# Patient Record
Sex: Female | Born: 1975 | Race: White | Hispanic: No | Marital: Married | Smoking: Former smoker
Health system: Southern US, Community
[De-identification: ages and names within clinical notes are randomized; demographics above are authoritative.]

## PROBLEM LIST (undated history)

## (undated) DIAGNOSIS — Z6825 Body mass index (BMI) 25.0-25.9, adult: Secondary | ICD-10-CM

## (undated) DIAGNOSIS — J329 Chronic sinusitis, unspecified: Secondary | ICD-10-CM

## (undated) DIAGNOSIS — R87619 Unspecified abnormal cytological findings in specimens from cervix uteri: Secondary | ICD-10-CM

## (undated) DIAGNOSIS — N83209 Unspecified ovarian cyst, unspecified side: Secondary | ICD-10-CM

## (undated) DIAGNOSIS — R51 Headache: Secondary | ICD-10-CM

## (undated) DIAGNOSIS — IMO0002 Reserved for concepts with insufficient information to code with codable children: Secondary | ICD-10-CM

## (undated) DIAGNOSIS — I10 Essential (primary) hypertension: Secondary | ICD-10-CM

## (undated) DIAGNOSIS — F419 Anxiety disorder, unspecified: Secondary | ICD-10-CM

## (undated) DIAGNOSIS — R002 Palpitations: Secondary | ICD-10-CM

## (undated) DIAGNOSIS — O009 Unspecified ectopic pregnancy without intrauterine pregnancy: Secondary | ICD-10-CM

## (undated) DIAGNOSIS — D649 Anemia, unspecified: Secondary | ICD-10-CM

## (undated) DIAGNOSIS — K219 Gastro-esophageal reflux disease without esophagitis: Secondary | ICD-10-CM

## (undated) HISTORY — DX: Gastro-esophageal reflux disease without esophagitis: K21.9

## (undated) HISTORY — DX: Chronic sinusitis, unspecified: J32.9

## (undated) HISTORY — DX: Palpitations: R00.2

## (undated) HISTORY — DX: Unspecified ovarian cyst, unspecified side: N83.209

## (undated) HISTORY — DX: Anxiety disorder, unspecified: F41.9

## (undated) HISTORY — PX: ECTOPIC PREGNANCY SURGERY: SHX613

## (undated) HISTORY — DX: Anemia, unspecified: D64.9

## (undated) HISTORY — DX: Essential (primary) hypertension: I10

## (undated) HISTORY — DX: Unspecified ectopic pregnancy without intrauterine pregnancy: O00.90

## (undated) HISTORY — DX: Unspecified abnormal cytological findings in specimens from cervix uteri: R87.619

## (undated) HISTORY — DX: Body mass index (BMI) 25.0-25.9, adult: Z68.25

## (undated) HISTORY — PX: TONSILLECTOMY: SHX5217

## (undated) HISTORY — DX: Reserved for concepts with insufficient information to code with codable children: IMO0002

## (undated) HISTORY — PX: CERVICAL CONE BIOPSY: SUR198

## (undated) HISTORY — DX: Headache: R51

---

## 1998-10-30 ENCOUNTER — Other Ambulatory Visit: Admission: RE | Admit: 1998-10-30 | Discharge: 1998-10-30 | Payer: Self-pay | Admitting: Obstetrics and Gynecology

## 1999-06-25 ENCOUNTER — Inpatient Hospital Stay (HOSPITAL_COMMUNITY): Admission: AD | Admit: 1999-06-25 | Discharge: 1999-06-27 | Payer: Self-pay | Admitting: Obstetrics and Gynecology

## 2001-04-29 ENCOUNTER — Other Ambulatory Visit: Admission: RE | Admit: 2001-04-29 | Discharge: 2001-04-29 | Payer: Self-pay | Admitting: Obstetrics and Gynecology

## 2001-10-26 ENCOUNTER — Inpatient Hospital Stay (HOSPITAL_COMMUNITY): Admission: AD | Admit: 2001-10-26 | Discharge: 2001-10-26 | Payer: Self-pay | Admitting: Obstetrics and Gynecology

## 2001-12-25 ENCOUNTER — Inpatient Hospital Stay (HOSPITAL_COMMUNITY): Admission: AD | Admit: 2001-12-25 | Discharge: 2001-12-25 | Payer: Self-pay | Admitting: Obstetrics and Gynecology

## 2001-12-25 ENCOUNTER — Encounter: Payer: Self-pay | Admitting: Obstetrics and Gynecology

## 2002-01-06 ENCOUNTER — Inpatient Hospital Stay (HOSPITAL_COMMUNITY): Admission: AD | Admit: 2002-01-06 | Discharge: 2002-01-07 | Payer: Self-pay | Admitting: Obstetrics and Gynecology

## 2002-12-26 ENCOUNTER — Other Ambulatory Visit: Admission: RE | Admit: 2002-12-26 | Discharge: 2002-12-26 | Payer: Self-pay | Admitting: Obstetrics and Gynecology

## 2003-11-14 ENCOUNTER — Inpatient Hospital Stay (HOSPITAL_COMMUNITY): Admission: AD | Admit: 2003-11-14 | Discharge: 2003-11-14 | Payer: Self-pay | Admitting: Obstetrics and Gynecology

## 2003-11-17 ENCOUNTER — Inpatient Hospital Stay (HOSPITAL_COMMUNITY): Admission: AD | Admit: 2003-11-17 | Discharge: 2003-11-17 | Payer: Self-pay | Admitting: Obstetrics and Gynecology

## 2003-11-20 ENCOUNTER — Inpatient Hospital Stay (HOSPITAL_COMMUNITY): Admission: AD | Admit: 2003-11-20 | Discharge: 2003-11-20 | Payer: Self-pay | Admitting: Obstetrics and Gynecology

## 2003-11-27 ENCOUNTER — Observation Stay (HOSPITAL_COMMUNITY): Admission: AD | Admit: 2003-11-27 | Discharge: 2003-11-28 | Payer: Self-pay | Admitting: Obstetrics and Gynecology

## 2003-11-28 ENCOUNTER — Encounter (INDEPENDENT_AMBULATORY_CARE_PROVIDER_SITE_OTHER): Payer: Self-pay | Admitting: *Deleted

## 2004-07-08 ENCOUNTER — Other Ambulatory Visit: Admission: RE | Admit: 2004-07-08 | Discharge: 2004-07-08 | Payer: Self-pay | Admitting: Obstetrics and Gynecology

## 2005-05-28 ENCOUNTER — Ambulatory Visit (HOSPITAL_BASED_OUTPATIENT_CLINIC_OR_DEPARTMENT_OTHER): Admission: RE | Admit: 2005-05-28 | Discharge: 2005-05-28 | Payer: Self-pay | Admitting: Otolaryngology

## 2005-06-07 ENCOUNTER — Ambulatory Visit: Payer: Self-pay | Admitting: Internal Medicine

## 2005-07-30 ENCOUNTER — Other Ambulatory Visit: Admission: RE | Admit: 2005-07-30 | Discharge: 2005-07-30 | Payer: Self-pay | Admitting: Obstetrics and Gynecology

## 2007-05-20 ENCOUNTER — Emergency Department (HOSPITAL_COMMUNITY): Admission: EM | Admit: 2007-05-20 | Discharge: 2007-05-20 | Payer: Self-pay | Admitting: Emergency Medicine

## 2007-07-06 ENCOUNTER — Encounter: Admission: RE | Admit: 2007-07-06 | Discharge: 2007-07-06 | Payer: Self-pay | Admitting: Family Medicine

## 2007-08-30 HISTORY — PX: US ECHOCARDIOGRAPHY: HXRAD669

## 2007-09-23 ENCOUNTER — Encounter (INDEPENDENT_AMBULATORY_CARE_PROVIDER_SITE_OTHER): Payer: Self-pay | Admitting: Obstetrics and Gynecology

## 2007-09-23 ENCOUNTER — Ambulatory Visit (HOSPITAL_COMMUNITY): Admission: RE | Admit: 2007-09-23 | Discharge: 2007-09-23 | Payer: Self-pay | Admitting: Obstetrics and Gynecology

## 2009-12-31 ENCOUNTER — Ambulatory Visit: Payer: Self-pay | Admitting: Cardiovascular Disease

## 2010-07-22 NOTE — Op Note (Signed)
Karina [name], Karina [name]               ACCOUNT NO.:  [id number]   MEDICAL RECORD NO.:  [id number]          PATIENT TYPE:  AMB   LOCATION:  SDC                           FACILITY:  [facility]   PHYSICIAN:  Janine Limbo, M.D.DATE OF BIRTH:  [birth year]   DATE OF PROCEDURE:  09/23/2007  DATE OF DISCHARGE:                               OPERATIVE REPORT   PREOPERATIVE DIAGNOSIS:  Cervical intraepithelial neoplasia III.   POSTOPERATIVE DIAGNOSIS:  Cervical intraepithelial neoplasia III.   PROCEDURE:  Cold knife conization.   SURGEON:  Leonard Schwartz, MD   FIRST ASSISTANT:  None.   ANESTHESIA:  Monitored anesthetic control, paracervical block using 0.5%  Marcaine with epinephrine.   DISPOSITION:  [name] is a 35 year old female who presents with CIN  III.  She understands the indications for her surgical procedure and she  accepts the risks associated with that procedure.   FINDINGS:  The patient was found to have an area of white epithelium at  the anterior cervical os but also the posterior cervical os.   PROCEDURE IN DETAIL:  The patient was taken to the operating room where  she was given medication through her IV line.  The patient's perineum  and vagina were prepped with multiple layers of Betadine.  Examination  under anesthesia was performed.  The patient was then sterilely draped.  Colposcopy was performed and the white epithelial lesions were noted  anteriorly and posteriorly at the cervical os.  We then placed Lugol's  solution on the cervix and the lesion was appropriately outlined.  A  paracervical block was then placed using 10 mL of 0.5%Marcaine with  epinephrine.  An additional 10 mL of 0.5% Marcaine with epinephrine were  injected directly into the cervix.  Figure-of-eight sutures were placed  at the 3 o'clock and 9 o'clock positions on the lateral cervix.  A  circumferential incision was made around the cervical os removing all of  the nonstaining areas.   The incision was then angled in a cone like  fashion toward the endocervix.  A stitch was placed at the 12 o'clock  position of the conization specimen.  The specimen was then sent to  pathology for evaluation.  The Bovie cautery was used for hemostasis.  We then placed inverting sutures at the 12 o'clock, 3 o'clock, 6  o'clock, and 9 o'clock positions of the cervix.  Hemostasis was  adequate.  Gelfoam was placed in the cervical os.  Sponge, needle, and  instrument counts were correct on two occasions.  The estimated blood  loss was 10 mL.  The patient tolerated her procedure very well.  She was  awakened from her anesthetic without difficulty and taken to the  recovery room in stable condition.   FOLLOWUP INSTRUCTIONS:  The patient will return to see Dr. Stefano Gaul in 2-  3 weeks for followup examination.  She will take Darvocet 1-2 tablets  every 4 hours as needed for severe pain.  She can take Motrin 800 mg  every 8  hours as needed for mild-to-moderate pain.  She was given Zofran 4 mg to  take every 4-6 hours for nausea.  She was given a copy of the  postoperative instruction sheet as prepared by the [facility]  [facility] for patients who have undergone dilatation and curettage, but  then was modified for conization of the cervix.      Janine Limbo, M.D.  Electronically Signed     AVS/MEDQ  D:  09/23/2007  T:  09/23/2007  Job:  [id number]

## 2010-07-25 NOTE — Discharge Summary (Signed)
[name], [name]               ACCOUNT NO.:  [id number]   MEDICAL RECORD NO.:  [id number]          PATIENT TYPE:  OBV   LOCATION:  9305                          FACILITY:  [facility]   PHYSICIAN:  Osborn Coho, M.D.   DATE OF BIRTH:  [birth year]   DATE OF ADMISSION:  11/27/2003  DATE OF DISCHARGE:  11/28/2003                                 DISCHARGE SUMMARY   DISCHARGE DIAGNOSIS:  Ruptured right ectopic pregnancy.   OPERATION:  On the date of admission, the patient underwent a laparoscopic  partial right salpingectomy, tolerating procedure well.  The patient was  found to have a ruptured right ectopic pregnancy with approximately 500 cc  of hemoperitoneum.   HISTORY OF PRESENT ILLNESS:  Karina [name] is a 35 year old female para 2-0-1-  2 who is status post IUD removal with a right ectopic pregnancy which was  treated with methotrexate.  The patient however began to experience  increasing abdominal pain and was therefore admitted to the hospital for  evaluation.  Upon admission, a pelvic ultrasound showed that she had a 7.8  cm right adnexal mass with moderate amount of free fluid in her right  abdominal region which was consistent with a ruptured ectopic pregnancy.   PREOPERATIVE PHYSICAL EXAMINATION:  BLOOD PRESSURE:  128/74.  PULSE:  85.  RESPIRATIONS:  20.  ABDOMEN:  Soft, with right lower quadrant and left lower quadrant  tenderness.  There was no rebound or guarding at the time of exam.  PELVIC:  Positive cervical motion tenderness, with small amount of blood in  the vaginal vault.   LABORATORIES:  The patient's blood type is A positive.  Her antibody screen  is negative.  Hematocrit was 34, and beta HCG 41.   HOSPITAL COURSE:  On the date of admission, the patient underwent  aforementioned procedure, tolerating it well.  Postoperative course was  unremarkable, with patient achieving the maximum benefit of her hospital  stay by the afternoon of her day of admission and was  therefore discharged  home.  Postop hemoglobin was 9.5 (preop hemoglobin 11.8).   DISCHARGE MEDICATIONS:  1.  Motrin 1 tablet q.6 h. as needed for pain.  2.  Vicodin 1-2 tablets q.4-6 h. as needed for pain.  3.  Phenergan 12.5 mg 1 tablet q.6 h. as needed for nausea.   FOLLOWUP:  The patient is to call [facility] for a two-week  postop visit with Dr. Su Hilt.   DISCHARGE INSTRUCTIONS:  The patient was advised to keep her incision site  clean and dry.  To call the office with a temperature of greater than 100.  She was advised to avoid intercourse for six weeks.   Her diet was without restriction.   FINAL PATHOLOGY:  Right fallopian tube:  Fallopian tube with villi and  associated trophoblastic proliferation consistent with ectopic pregnancy.      EJP/MEDQ  D:  12/18/2003  T:  12/19/2003  Job:  [id number]

## 2010-07-25 NOTE — Procedures (Signed)
[name], [name]               ACCOUNT NO.:  [id number]   MEDICAL RECORD NO.:  [id number]          PATIENT TYPE:  OUT   LOCATION:  SLEEP CENTER                 [facility]ACILITY[facility]  [facility]   PHYSICIAN:  Clinton D. Maple Hudson, M.D. DATE OF BIRTH:  March 19, 1975   DATE OF STUDY:  05/28/2005                              NOCTURNAL POLYSOMNOGRAM   INDICATION FOR STUDY:  Hypersomnia with sleep apnea.  Epworth sleepiness  score 4/21, BMI 27.  Weight 165 pounds.  No home medications.   SLEEP ARCHITECTURE:  Total sleep time 378 minutes with sleep efficiency 93%.  Stage I was 1%, stage II 45%, stages III and IV 35%.  REM 19% of total sleep  time.  Sleep latency 20 minutes, REM latency 83 minutes, awake after sleep  onset 7 minutes, arousal index 14.  Advil was taken at bedtime for headache.   RESPIRATORY DATA:  Apnea/hypopnea index (AHI, RDI) 1.3 obstructive events  per hour which is within normal limits (normal 0-5 per hour).  This  reflected a total of 3 obstructive apneas and 5 hypopneas.  Most events were  recorded while sleeping supine.  REM AHI 0.  CPAP titration was not  indicated by protocol.   OXYGEN DATA:  Mild to moderate snoring with mouth breathing, oxygen  desaturation to a nadir of 94%.  Mean oxygen saturation through the study  was 97% on room air.   CARDIAC DATA:  Normal sinus rhythm.   MOVEMENT/PARASOMNIA:  A total of 138 limb jerks were recorded of which 20  were associated with arousal or awakening for a periodic limb movement with  arousable index of 3.2 per hour which is mildly increased.  No bathroom  trips.   IMPRESSION/RECOMMENDATIONS:  1.  Normal sleep architecture without sedative medication other than Advil      taken at bedtime for headache.  2.  Occasional sleep disorder breathing events, apnea/hypopnea index 1.3 per      hour.  Events were positional, associated primarily with supine sleeping      position.  3.  Mild periodic limb movement with arousal, 3.2 per  hour.      Clinton D. Maple Hudson, M.D.  Diplomate, Biomedical engineer of Sleep Medicine  Electronically Signed     CDY/MEDQ  D:  06/07/2005 10:43:06  T:  06/08/2005 12:01:49  Job:  [id number]

## 2010-07-25 NOTE — H&P (Signed)
[facility]  Patient:    Karina [name], Karina [name]                      MRN: [id number] Adm. Date:  [id number] Attending:  Shaune Spittle Dictator:   Mack Guise, C.N.M.                         History and Physical  HISTORY OF PRESENT ILLNESS:   Ms. Cislo is a 35 year old gravida 1, para 0, at 39-4/7 weeks, EDD June 28, 1999, by questionable dates, confirmed by 18 weeks, 0 days ultrasound, who presents to the hospital at 1825 following spontaneous rupture of membranes at home at approximately 5 p.m. this afternoon for a large amount of clear fluid.  She has begun to contract mildly and irregular since rupture of membranes.  She reports positive fetal movement, no bleeding.  Denies any headache, visual changes, or epigastric pain.  Her pregnancy has been followed by the CNM service at [facility] and is remarkable for:  1. Questionable LMP; 2. History of irregular cycles; 3. Rubella nonimmune; 4. Group B strep negative. DD:  06/25/99 TD:  06/25/99 Job: [id number]

## 2010-07-25 NOTE — H&P (Signed)
   NAME:  Karina [name], Karina [name]                         ACCOUNT NO.:  [id number]   MEDICAL RECORD NO.:  [id number]                   PATIENT TYPE:  INP   LOCATION:  9174                                 FACILITY:  [facility]   PHYSICIAN:  Crist Fat. Rivard, M.D.              DATE OF BIRTH:  [birth year]   DATE OF ADMISSION:  01/06/2002  DATE OF DISCHARGE:                                HISTORY & PHYSICAL   HISTORY OF PRESENT ILLNESS:  The patient is a 35 year old gravida 2, para 1-  0-0-1 at 41-2/7 weeks who presents for induction secondary to post date.  Cervix was 2-3 cm in the office last week.   Dictation ended at this point.     Renaldo Reel Emilee Hero, C.N.M.                   Crist Fat Rivard, M.D.    Leeanne Mannan  D:  01/06/2002  T:  01/06/2002  Job:  [id number]

## 2010-07-25 NOTE — H&P (Signed)
NAME:  Karina [name], Karina [name]                         ACCOUNT NO.:  [id number]   MEDICAL RECORD NO.:  [id number]                   PATIENT TYPE:  INP   LOCATION:  9174                                 [facility]ACILITY:  [facility]   PHYSICIAN:  Crist Fat. Rivard, M.D.              DATE OF BIRTH:  10/31/1975   DATE OF ADMISSION:  01/06/2002  DATE OF DISCHARGE:                                HISTORY & PHYSICAL   HISTORY OF PRESENT ILLNESS:  The patient is a 35 year old gravida 2, para 1-  0-0-1 at 41-2/7 weeks who presents for induction secondary to post dates.  Cervix has been 2-3 cm in the office.  Pregnancy has been remarkable for (1)  positive group B strep, (2) a history of irregular cycles, (3) rubella  nonimmune, (4) history of previous transverse lie with resolution to vertex  in the last three weeks.   PRENATAL LABORATORY DATA:  Blood type is A positive, Rh antibody negative,  VDRL nonreactive, rubella titer is nonimmune, hepatitis B surface antigen is  negative.  GC and Chlamydia cultures were negative in February, Pap was  normal in February.  Glucose challenge was normal, AFP was declined.  EDC of  December 30, 2001 was established by ultrasound in April at 11 weeks and  confirmed by 18-week ultrasound.  Group B strep culture was positive at 36  weeks.  Hemoglobin upon entering the practice was 13.  It was 10.9 at 26  weeks.   HISTORY OF PRESENT PREGNANCY:  The patient entered care at approximately 13  weeks.  The patient had also declined cystic fibrosis testing.  She had a  normal ultrasound at 11 weeks and then again at 18 weeks.  She declined AFP.  She had some glucosuria at 29 weeks.  Infant was vertex at 34 weeks, repeat  at 35 weeks showed the same positioning.  Options were reviewed and the  patient elected to proceed with external version.  It was scheduled on  October 3rd.  However, on October 1st she was in the office and the  ultrasound was found to be vertex.  The patient was seen  in Maternity  Admissions Unit at approximately 39 weeks for labor check.  She had  glucosuria again at 38 weeks which showed a value of 139.  She had a fasting  blood sugar at 40 weeks that was within normal limits.  Membranes were  stripped at 40-1/2 weeks and the patient was scheduled for induction per her  request.  Risks and benefits of induction were reviewed with the patient and  she did wish to proceed.   OBSTETRICAL HISTORY:  In April of 2001, she had a vaginal birth of a female  infant, weight 8 pounds 6 ounces at 39-5/7 weeks.  She was in labor 11  hours.  She had epidural anesthesia.   PAST MEDICAL HISTORY:  The only hospitalization was for childbirth.  ALLERGIES:  She has no known medication allergies.   FAMILY HISTORY:  Father has a history of hypertension.  Paternal grandfather  had non-insulin-dependent diabetes.   GENETIC HISTORY:  Unremarkable.   SOCIAL HISTORY:  The patient is married to the father of the baby.  He is  involved and supportive.  His name is Medical sales representative.  The patient has been followed  by the Certified Nurse Midwife Service of [place].  She denies  any alcohol, drug, or tobacco use during this pregnancy.  She has two years  of college and her husband has high school education.  The patient is  employed part-time.   PHYSICAL EXAMINATION:  VITAL SIGNS:  Stable.  The patient is afebrile.  HEENT:  Within normal limits.  LUNGS:  Vibrations are clear.  HEART:  Regular rate and rhythm without murmur.  BREASTS:  Soft and nontender.  ABDOMEN:  Fundal height is approximately 39 weeks.  Estimated fetal weight 8  to 8-1/2 pounds.  Uterine contractions are irregular and mild.  PELVIC:  Cervical exam is deferred at present and will be performed when  artificial rupture of membranes is anticipated after IV antibiotic dose.  FETAL MONITORING:  Fetal heart rate shows a reactive fetal heart rate  tracing on admission.  The patient did have a syncopal  episode with her IV  start with fetal heart rate elevated to a baseline of 150-160 with one  deceleration while the patient was on her back.  This deceleration resolved  with position change and resolution of the syncope.  Fetal heart rate is now  reassuring.  EXTREMITIES:  Deep tendon reflexes are 2+ without clonus.  There is a trace  edema noted.   IMPRESSION:  1. Intrauterine pregnancy at 41-2/7 weeks.  2. Favorable cervix.  3. Positive group B strep.  4. Rubella not immune.   PLAN:  1. Admit to birthing suite for consult with Dois Davenport A. Rivard, M.D. as     attending physician.  2. Routine Certified Nurse Midwife orders.  3. Plan group B strep prophylaxis with penicillin-G per standard dosing.  4. Plan artificial rupture of membranes after IV antibiotic infused.  5. The patient will desire epidural as labor progresses.      Renaldo Reel Karina [name], C.N.M.                   Crist Fat Rivard, M.D.    Karina [name]  D:  01/06/2002  T:  01/06/2002  Job:  [id number]

## 2010-07-25 NOTE — H&P (Signed)
[facility]  Patient:    Karina [name], Karina [name]                      MRN: [id number] Adm. Date:  [id number] Attending:  Shaune Spittle Dictator:   Mack Guise, C.N.M.                         History and Physical  HISTORY OF PRESENT PREGNANCY:                    This patient was initially evaluated at the office of [facility] on December 17, 1998, at approximately [redacted] weeks gestation.  EDD by ? LMP date confirmed by 18-week, 0-day ultrasonography.  The patients prenatal course has been essentially unremarkable.  She has been followed by the C.N.M. service and throughout her prenatal course, the growth of the uterine fundus has been compatible in size with her date.  Systolic blood pressure has ranged from 100-120, diastolic blood pressure has ranged from 60-80.  There has been no proteinuria.  PRENATAL LABORATORY DATA:     On January 03, 1999, hemoglobin and hematocrit 12.4 and 36.1, platelets 299,000.  Blood type and Rh A Positive.  Antibody screen negative.  Toxo titers negative negative.  VDRL nonreactive.  Rubella nonimmune.  Hepatitis B surface antigen negative.  MSA of P testing declined on April 07, 1999.  Twenty-eight week, one-hour glucose challenge 51. Hemoglobin 11.5.  At 36 weeks, culture of vaginal tract is negative for group beta Streptococcus.  OBSTETRICAL HISTORY:          Present pregnancy.  MEDICAL HISTORY:              Unremarkable.  FAMILY HISTORY:               Patients father has hypertension.  Paternal grandfather diabetes.  Paternal aunt seizure disorder on medications.  GENETIC HISTORY:              There is no family history of familial or genetic disorders, children that died, or that were born with birth defects.  ALLERGIES:                    No known drug allergies.  MEDICATIONS:                  Prenatal vitamins.  HABITS:                       The patient denies the use of tobacco, alcohol, or illicit  drugs.  SOCIAL HISTORY:               Ms. Karnes is a 35 year old Caucasian married female.  Her husbands name is Medical sales representative.  He is involved and supportive.  REVIEW OF SYSTEMS:            There are no signs or symptoms suggestive of focal or systemic disease and the patient is typical of one with a uterine pregnancy at term with premature rupture of membranes.  PHYSICAL EXAMINATION  VITAL SIGNS:                  Stable.  Afebrile.  HEENT:                        Unremarkable.  CARDIAC:  Heat is regular in its rate and rhythm.  LUNGS:                        Clear.  ABDOMEN:                      Gravid in its contour.  Uterine fundus is noted to extend 38 cm above the level of the pubic symphysis.  Leopolds maneuver finds to be in a longitudinal lie, cephalic presentation, and the estimated fetal weight is 8 pounds.  The baseline of the fetal heart rate monitor is 130-140 with average lungs and with variability.  Reactivity is present with no periodic changes.  The patient is contracting mildly and irregularly. Since her water broke, she states they are somewhat increasing in her intensity.  PELVIC:                       On exam in MAU, [place] Dom, C.N.M. noted positive pulling, positive nitrazine.  Patients cervix at that time was 2 cm, 75% with a vertex at a -2 station, with copious amounts of clear fluid.  ASSESSMENT:                   1. Intrauterine pregnancy at term.                               2. Premature rupture of membranes.  PLAN:                         1. Admit to birthing suite per                                  Dr. Dierdre Forth.                               2. Routine C.N.M. orders.  CONSENT:                      Discussed options with patient regarding premature rupture of membranes and induction of labor, versus expectant management.  The increased risk of infection was discussed, as well as the risks and benefits of induction of  labor with Pitocin and the recommendation to begin Pitocin at approximately 8 hours after premature rupture of membranes.  The patient and husband verbalized understanding and indicated their desire to start to continue expectant management throughout the night with the hopes of spontaneous labor and if no spontaneous active labor by 5 a.m. will begin Pitocin at that time. DD:  06/25/99 TD:  06/25/99 Job: [id number]

## 2010-07-25 NOTE — Op Note (Signed)
NAMEABRIL, CAPPIELLO               ACCOUNT NO.:  [id number]   MEDICAL RECORD NO.:  [id number]          PATIENT TYPE:  INP   LOCATION:  9305                          FACILITY:  [facility]   PHYSICIAN:  Osborn Coho, M.D.   DATE OF BIRTH:  [birth year]   DATE OF PROCEDURE:  11/28/2003  DATE OF DISCHARGE:  11/28/2003                                 OPERATIVE REPORT   PREOPERATIVE DIAGNOSES:  Ruptured right ectopic.   POSTOPERATIVE DIAGNOSES:  Ruptured right ectopic.   PROCEDURE:  Laparoscopic partial right salpingectomy.   ANESTHESIA:  General.   ATTENDING PHYSICIAN:  Osborn Coho, M.D.   FLUID:  1700 mL.   ESTIMATED BLOOD LOSS:  500 mL in abdomen.   URINE OUTPUT:  100 mL.   COMPLICATIONS:  None.   FINDINGS:  Approximately 500 mL hemoperitoneum and rupture of right ectopic.   PROCEDURE:  The patient was taken to the operating room after the risks,  benefits, and alternatives were discussed with the patient. The patient  verbalized understanding and consent signed and witnessed. The patient was  placed under general anesthesia no acute distress prepped and draped in the  normal sterile fashion in dorsal lithotomy position.  A bivalve speculum was  placed in the patient's vagina and a Hulka tenaculum placed for intrauterine  manipulation.  The speculum was then removed. Attention was then turned to  the abdomen where a 10 mm umbilical incision was made and the Veress needle  passed and pneumoperitoneum achieved. The Veress needle was removed and 10  mm trocar advanced into the abdomen.  Laparoscope introduced and large  hemoperitoneum noted.  A 5 mm incision was made suprapubically and a 5 mm  trocar advanced under direct visualization. The same was done in the left  lower quadrant. Under direct visualization, a 5 mm trocar was introduced  after a 5 mm incision made.  The adnexa was identified and ruptured right  ectopic noted. There is a significant amount of blood in the  cul-de-sac up  the pericolic gutters and around the liver.  Two Vicryl endoloops were  placed around the right fallopian tube with ectopic and the tripolar was  used to cauterize and cut the fallopian tube away which was removed via the  10 mm suprapubic port.  The abdomen was copiously suction irrigated removing  a large portion of the clot and hemoperitoneum.  The pedicle was noted to be  hemostatic.  The 10 mm suprapubic trocar was removed under direct  visualization.  Pneumoperitoneum was released.  Prior to relieving the  pneumoperitoneum, the fascia was repaired at the suprapubic port with the #0  Vicryl in a running fashion.  The pneumoperitoneum was relieved and left  lower quadrant trocar was removed under direct visualization.  The umbilical  trocar and laparoscope were removed while watching the trocar via the  laparoscope.  The umbilical incision was repaired at the fascia using #0  Vicryl in a  running fashion.  The skin was closed with 3-0 Monocryl at the umbilicus,  suprapubic incision and left lower quadrant incision. Sponge, lap and needle  count  was correct. The patient tolerated the procedure well and was returned  to the recovery room in good condition.      AR/MEDQ  D:  11/28/2003  T:  11/28/2003  Job:  [id number]

## 2010-12-01 LAB — I-STAT 8, (EC8 V) (CONVERTED LAB)
Acid-Base Excess: 2
BUN: 13
Bicarbonate: 26.6 — ABNORMAL HIGH
Chloride: 104
Glucose, Bld: 102 — ABNORMAL HIGH
HCT: 37
Hemoglobin: 12.6
Operator id: 222501
Potassium: 3.9
Sodium: 138
TCO2: 28
pCO2, Ven: 41.8 — ABNORMAL LOW
pH, Ven: 7.411 — ABNORMAL HIGH

## 2010-12-01 LAB — URINALYSIS, ROUTINE W REFLEX MICROSCOPIC
Bilirubin Urine: NEGATIVE
Glucose, UA: NEGATIVE
Ketones, ur: NEGATIVE
Leukocytes, UA: NEGATIVE
Nitrite: NEGATIVE
Protein, ur: NEGATIVE
Specific Gravity, Urine: 1.012
Urobilinogen, UA: 0.2
pH: 7

## 2010-12-01 LAB — POCT I-STAT CREATININE
Creatinine, Ser: 0.9
Operator id: 222501

## 2010-12-01 LAB — URINE MICROSCOPIC-ADD ON

## 2010-12-05 LAB — CBC
HCT: 39.2
Hemoglobin: 13.1
MCHC: 33.3
MCV: 89.2
Platelets: 297
RBC: 4.4
RDW: 16.1 — ABNORMAL HIGH
WBC: 6.2

## 2010-12-05 LAB — BASIC METABOLIC PANEL
BUN: 23
CO2: 29
Calcium: 9.4
Chloride: 102
Creatinine, Ser: 0.66
GFR calc Af Amer: >60
GFR calc non Af Amer: >60
Glucose, Bld: 92
Potassium: 4.3
Sodium: 140

## 2010-12-05 LAB — URINALYSIS, ROUTINE W REFLEX MICROSCOPIC
Bilirubin Urine: NEGATIVE
Glucose, UA: NEGATIVE
Ketones, ur: NEGATIVE
Leukocytes, UA: NEGATIVE
Nitrite: NEGATIVE
Protein, ur: NEGATIVE
Specific Gravity, Urine: 1.02
Urobilinogen, UA: 0.2
pH: 7

## 2010-12-05 LAB — URINE MICROSCOPIC-ADD ON

## 2010-12-05 LAB — PREGNANCY, URINE: Preg Test, Ur: NEGATIVE

## 2011-06-12 ENCOUNTER — Encounter: Payer: Self-pay | Admitting: *Deleted

## 2011-06-15 ENCOUNTER — Encounter: Payer: Self-pay | Admitting: Cardiovascular Disease

## 2011-06-15 ENCOUNTER — Ambulatory Visit (INDEPENDENT_AMBULATORY_CARE_PROVIDER_SITE_OTHER): Payer: PRIVATE HEALTH INSURANCE | Admitting: Cardiovascular Disease

## 2011-06-15 VITALS — BP 126/84 | HR 89 | Ht 64.0 in | Wt 161.4 lb

## 2011-06-15 DIAGNOSIS — R002 Palpitations: Secondary | ICD-10-CM | POA: Insufficient documentation

## 2011-06-15 NOTE — Patient Instructions (Signed)
Your physician wants you to follow-up in: 1 YEAR You will receive a reminder letter in the mail two months in advance. If you don't receive a letter, please call our office to schedule the follow-up appointment.  

## 2011-06-15 NOTE — Assessment & Plan Note (Signed)
Karina [name] is doing very well. Karina [name] not having having any further episodes of palpitations. She is committed to working and  stopping smoking. Karina [name] also working on a diet and have her to lose weight.  Karina [name] not required any propranolol recently. We'll see her again in one year for an office visit and EKG.

## 2011-06-15 NOTE — Progress Notes (Signed)
    Corrie Dandy Date of Birth  10/27/1975 [facility] Office  [address]    [address]   [address] [place], [place]  [address]    [place], [place]  [address] [phone]  Fax  [phone]  [phone]  Fax [phone]  Problem list: 1. Chest pains 2. Palpitations 3. Anemia 4. Vitamin D deficiency 5. Anxiety 6. History of anemia  History of Present Illness:  Karina [name] is a very pleasant 36 year old female who have seen in the past for episodes of palpitations, anxiety, chest pain, and vitamin D deficiency. She's done very well since I last saw her last year. She's not required any propranolol. She's been able to do all of her normal activities without any significant problems. Unfortunately she still smokes but she is committed to stopping smoking. She was to join a gym and turn her life around.  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     Current Outpatient Prescriptions  Medication Sig Dispense Refill  . IRON PO Take by mouth daily.      . NON FORMULARY Q Nasal Spray Daily         No Known Allergies  Past Medical History  Diagnosis Date  . Anemia   . GERD (gastroesophageal reflux disease)   . Anxiety   . Sinus infection   . Headache   . Vitamin d deficiency   . Palpitations     Past Surgical History  Procedure Date  . Ectopic pregnancy surgery     ruptured  . Tonsillectomy   . US echocardiography 08-30-2007    EF 60-65%    History  Smoking status  . Current Everyday Smoker  Smokeless tobacco  . Not on file    History  Alcohol Use  . Yes    Daily    No family history on file.  Reviw of Systems:  Reviewed in the HPI.  All other systems are negative.  Physical Exam: Blood pressure 126/84, pulse 89, height 5\' 4"  (1.626 m), weight 161 lb 6.4 oz (73.211 kg). General: Well developed, well nourished, in no acute distress.  Head:  Normocephalic, atraumatic, sclera non-icteric, mucus membranes are moist,   Neck: Supple. Carotids are 2 + without bruits. No JVD  Lungs: Clear bilaterally to auscultation.  Heart: regular rate.  normal  S1 S2. No murmurs, gallops or rubs.  Abdomen: Soft, non-tender, non-distended with normal bowel sounds. No hepatomegaly. No rebound/guarding. No masses.  Msk:  Strength and tone are normal  Extremities: No clubbing or cyanosis. No edema.  Distal pedal pulses are 2+ and equal bilaterally.  Neuro: Alert and oriented X 3. Moves all extremities spontaneously.  Psych:  Responds to questions appropriately with a normal affect.  ECG: 06/15/2011-  NSR. Normal ECG  Assessment / Plan:

## 2011-09-16 ENCOUNTER — Ambulatory Visit (INDEPENDENT_AMBULATORY_CARE_PROVIDER_SITE_OTHER): Payer: BC Managed Care – PPO

## 2011-09-16 ENCOUNTER — Other Ambulatory Visit: Payer: PRIVATE HEALTH INSURANCE

## 2011-09-16 ENCOUNTER — Other Ambulatory Visit: Payer: Self-pay | Admitting: Family Medicine

## 2011-09-16 ENCOUNTER — Telehealth: Payer: Self-pay | Admitting: Obstetrics and Gynecology

## 2011-09-16 ENCOUNTER — Encounter: Payer: Self-pay | Admitting: Obstetrics and Gynecology

## 2011-09-16 ENCOUNTER — Ambulatory Visit (INDEPENDENT_AMBULATORY_CARE_PROVIDER_SITE_OTHER): Payer: BC Managed Care – PPO | Admitting: Obstetrics and Gynecology

## 2011-09-16 VITALS — BP 120/82 | Resp 14 | Ht 65.0 in | Wt 160.0 lb

## 2011-09-16 DIAGNOSIS — B977 Papillomavirus as the cause of diseases classified elsewhere: Secondary | ICD-10-CM

## 2011-09-16 DIAGNOSIS — R109 Unspecified abdominal pain: Secondary | ICD-10-CM

## 2011-09-16 DIAGNOSIS — R102 Pelvic and perineal pain: Secondary | ICD-10-CM

## 2011-09-16 DIAGNOSIS — Z9889 Other specified postprocedural states: Secondary | ICD-10-CM

## 2011-09-16 DIAGNOSIS — D069 Carcinoma in situ of cervix, unspecified: Secondary | ICD-10-CM

## 2011-09-16 DIAGNOSIS — N949 Unspecified condition associated with female genital organs and menstrual cycle: Secondary | ICD-10-CM

## 2011-09-16 LAB — US OB FOLLOW UP

## 2011-09-16 MED ORDER — TRAMADOL HCL 50 MG PO TABS: 50.0000 mg | ORAL_TABLET | Freq: Four times a day (QID) | ORAL | Status: DC | PRN

## 2011-09-16 NOTE — Progress Notes (Signed)
HISTORY OF PRESENT ILLNESS  [name] is a 36 y.o. year old female,No obstetric history on file., who presents for a problem visit. The patient has a history of an ovarian cyst causing pelvic pain.  She has a history of CIN-3 and HPV.  She is status post conization of the cervix.  The patient complains of a 2 to three-week history of right and left lateral abdominal pain.  She complains of hematuria.  She was evaluated by her primary physician who treated her with antibiotics.  She reports that her urine culture was negative.  Her lab work was negative.  Subjective:  The patient complains of abdominal pain and hematuria.  She denies GI and GU symptoms other than above.  She has had some nausea in the morning. Complains of irregular spotting.  She has not been sexually active for 2-3 weeks.  Objective:  BP 120/82  Resp 14  Ht 5\' 5"  (1.651 m)  Wt 160 lb (72.576 kg)  BMI 26.63 kg/m2  LMP 08/26/2011   General: alert, cooperative and no distress Resp: clear to auscultation bilaterally Cardio: regular rate and rhythm, S1, S2 normal, no murmur, click, rub or gallop GI: soft, non-tender; bowel sounds normal; no masses,  no organomegaly and slight tenderness with palpation of the mid abdomen laterally   External genitalia: normal general appearance Vaginal: normal without tenderness, induration or masses Cervix: normal appearance and small amount of blood present Adnexa: normal bimanual exam Uterus: normal size, tender fundus  Assessment:  Abdominal pain History of ovarian cysts Irregular bleeding 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  Endometrium 0.66 cm.  Ovaries normal size and appearance.  Resolving corpus luteum cyst on the right measuring 1.6 cm.  No other abnormalities noted Ultram 50 mg tablets one or 2 every 4-6 hours as needed for pain The etiologies for abdominal pain were reviewed including ruptured  ovarian cysts and kidney stones. Check pregnancy test - negative.  Return to office prn if symptoms worsen or fail to improve.   Leonard Schwartz M.D.  09/16/2011 2:39 PM    .

## 2011-09-16 NOTE — Telephone Encounter (Signed)
Returned pt's call regarding wanting an ov today for poss ovarian cyst. Pt stated that she saw her PCP for hematuria and had pain with palpated pelvic area. PCP had scheduled pt to have an U/S @ [facility] however pt wanted to come in for evaluation. Pt has ov with Dr. Stefano Gaul @ 1:45. Mathis Bud

## 2011-09-16 NOTE — Telephone Encounter (Signed)
Triage/pt last seen on 02/25/2010-no future appt.

## 2011-09-16 NOTE — Telephone Encounter (Signed)
Tc to pt regarding message. Pt got another call from someone in the office pt continued with that call.

## 2011-09-25 ENCOUNTER — Other Ambulatory Visit: Payer: Self-pay | Admitting: Obstetrics and Gynecology

## 2011-09-25 DIAGNOSIS — R102 Pelvic and perineal pain: Secondary | ICD-10-CM

## 2011-10-21 ENCOUNTER — Ambulatory Visit
Admission: RE | Admit: 2011-10-21 | Discharge: 2011-10-21 | Disposition: A | Discharge status: Home / Self Care | Payer: BC Managed Care – PPO | Source: Ambulatory Visit | Attending: Family Medicine | Admitting: Family Medicine

## 2011-10-21 ENCOUNTER — Other Ambulatory Visit: Payer: Self-pay | Admitting: Family Medicine

## 2011-10-21 DIAGNOSIS — M545 Low back pain, unspecified: Secondary | ICD-10-CM

## 2011-10-21 DIAGNOSIS — R10814 Left lower quadrant abdominal tenderness: Secondary | ICD-10-CM

## 2011-10-26 ENCOUNTER — Telehealth: Payer: Self-pay | Admitting: Obstetrics and Gynecology

## 2011-10-26 ENCOUNTER — Encounter: Payer: Self-pay | Admitting: Obstetrics and Gynecology

## 2011-10-26 ENCOUNTER — Ambulatory Visit (INDEPENDENT_AMBULATORY_CARE_PROVIDER_SITE_OTHER): Payer: BC Managed Care – PPO | Admitting: Obstetrics and Gynecology

## 2011-10-26 VITALS — BP 126/68 | Temp 98.5°F | Ht 62.0 in | Wt 164.0 lb

## 2011-10-26 DIAGNOSIS — N83209 Unspecified ovarian cyst, unspecified side: Secondary | ICD-10-CM | POA: Insufficient documentation

## 2011-10-26 DIAGNOSIS — R109 Unspecified abdominal pain: Secondary | ICD-10-CM

## 2011-10-26 DIAGNOSIS — M549 Dorsalgia, unspecified: Secondary | ICD-10-CM

## 2011-10-26 LAB — POCT URINALYSIS DIPSTICK
Bilirubin, UA: NEGATIVE
Glucose, UA: NEGATIVE
Ketones, UA: NEGATIVE
Nitrite, UA: NEGATIVE
Spec Grav, UA: 1.025
Urobilinogen, UA: NEGATIVE
pH, UA: 5

## 2011-10-26 LAB — POCT WET PREP (WET MOUNT)
Bacteria Wet Prep HPF POC: NEGATIVE
Clue Cells Wet Prep Whiff POC: NEGATIVE
KOH Wet Prep POC: NEGATIVE
Trichomonas Wet Prep HPF POC: NEGATIVE
WBC, Wet Prep HPF POC: NEGATIVE
pH: 5.5

## 2011-10-26 NOTE — Progress Notes (Signed)
C/o of ongoing lower abd pain and a lower back pain so bad I can hardly stand up in the mornings, the back is worse than the abdomen, no N,V,D, or constipation, daily bm, normal menses this month crampy, no concerns with vaginal discharge or itching, declines STD testing. On bactrim DS for UTI. Calm, no distress cooperative Neg CVAT, bowel sounds active x 4, no masses palpable, no guarding, Normal hair distrubition mons pubis,  EGBUS WNL, sterile speculum exam,  vagina pink, moist normal rugae,  cerix LTC, no cervical motion tenderness, No adnexal masses or tenderness Wet prep neg A muscular skeletal pain P    Referral to orthopedics, ice, heat alternating to back, discussed role of physical therapy, aleve BID q 12 hours, report if symptoms change or worsen, finish Bactrim DS RX,  F/O  annually Lavera Guise, CNM

## 2011-10-26 NOTE — Telephone Encounter (Signed)
Tc to pt regarding msg below.  Pt states has been having lower abd pain. Was given tx for BV, but continues with the pain.  Per MK ok to put on schedule, pt w/i today @1430 .

## 2011-10-26 NOTE — Progress Notes (Signed)
Contraception: none History of STD:  no history of PID, STD's History of ovarian cyst: yes:   History of fibroids: no History of endometriosis:no Previous ultrasound: yes  Urinary symptoms: none Gastro-intestinal symptoms:  Constipation: no     Diarrhea: no     Nausea: no     Vomiting: no     Fever: no Vaginal discharge: no vaginal discharge  Pt c/o lower back pain and abdominal pain. Pt is currently being treated for bacterial infection

## 2011-11-19 ENCOUNTER — Ambulatory Visit (INDEPENDENT_AMBULATORY_CARE_PROVIDER_SITE_OTHER): Payer: BC Managed Care – PPO | Admitting: Obstetrics and Gynecology

## 2011-11-19 ENCOUNTER — Encounter: Payer: Self-pay | Admitting: Obstetrics and Gynecology

## 2011-11-19 VITALS — BP 110/70 | Resp 18 | Wt 163.0 lb

## 2011-11-19 DIAGNOSIS — R87610 Atypical squamous cells of undetermined significance on cytologic smear of cervix (ASC-US): Secondary | ICD-10-CM

## 2011-11-19 NOTE — Progress Notes (Signed)
Regular Periods: yes Mammogram: no  Monthly Breast Ex.: yes Exercise: yes  Tetanus < 10 years: no Seatbelts: yes  NI. Bladder Functn.: yes Abuse at home: no  Daily BM's: yes Stressful Work: no  Healthy Diet: yes Sigmoid-Colonoscopy: never  Calcium: no Medical problems this year: "no complaints just regular check up"   LAST PAP:09/04/10 ASCUS  Contraception: none  Mammogram:  none  PCP: wong, francis  PMH: no change  FMH: no change  Last Bone Scan: never

## 2011-11-21 ENCOUNTER — Encounter: Payer: Self-pay | Admitting: Obstetrics and Gynecology

## 2011-11-21 NOTE — Progress Notes (Signed)
Subjective:    Karina [name] is a 36 y.o. female, [phone], who presents for an annual exam.   She states she continues to have persistent abdominal pain last several months, had w/u per AVS in July and f/u w [facility] in August, w negative findings. She states she wakes up with this pain and it is somewhat better after initially voiding, but typically lasts a few hours in the AM. Nothing seems to make the pain worse or better, other than time passing.  She denies any urinary complaints, reports daily BM,  denies any discharge, no change in menstrual cycles. She denies any N/V.  She does report occasional dyspareunia w deep penetration. 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 She is not using any birthing control and does not desire pregnancy.     History   Social History  . Marital Status: Married    Spouse Name: N/A    Number of Children: N/A  . Years of Education: N/A   Social History Main Topics  . Smoking status: Current Every Day Smoker  . Smokeless tobacco: None  . Alcohol Use: Yes     Daily  . Drug Use: None  . Sexually Active: Yes    Birth Control/ Protection: None   Other Topics Concern  . None   Social History Narrative  . None    Menstrual cycle:   LMP: Patient's last menstrual period was 11/07/2011.           Cycle: regular "have always been heavy"   The following portions of the patient's history were reviewed and updated as appropriate: allergies, current medications, past family history, past medical history, past social history, past surgical history and problem list.  Review of Systems Pertinent items are noted in HPI. Breast:Negative for breast lump,nipple discharge or nipple retraction Gastrointestinal: positive for abdominal pain, no change in bowel habits or rectal bleeding Urinary:negative   Objective:    BP 110/70  Resp 18  Wt 163 lb (73.936 kg)  LMP 11/07/2011    Weight:  Wt Readings from Last 1 Encounters:  11/19/11 163 lb (73.936 kg)           BMI: There is no height on file to calculate BMI.  General Appearance: Alert, appropriate appearance for age. No acute distress HEENT: Grossly normal Neck / Thyroid: Supple, no masses, nodes or enlargement Lungs: clear to auscultation bilaterally Back: No CVA tenderness Breast Exam: No masses or nodes.No dimpling, nipple retraction or discharge. Cardiovascular: Regular rate and rhythm. S1, S2, no murmur Gastrointestinal: Soft, tender to deep palpation just above umbilicus , no masses or organomegaly Pelvic Exam: Vulva and vagina appear normal. Bimanual exam reveals normal uterus and adnexa. No masses, neg CMT.  Rectovaginal: not indicated Lymphatic Exam: Non-palpable nodes in neck, clavicular, axillary, or inguinal regions  Skin: no rash or abnormalities Neurologic: Normal gait and speech, no tremor  Psychiatric: Alert and oriented, appropriate affect.   Wet Prep: not indicated Urinalysis:not applicable UPT: Not done   Assessment:    Normal gyn exam  Abdominal pain does not appear to be GYN in origin    Plan:    pap smear return annually or prn STD screening: declined Contraception:natural family planning (NFP), rhythm method I recommend pt refer to her primary care physician and consider a GI referral for ongoing abd pain.  Recommend quitting smoking Continue other healthy habits, exercise, sun-screen       Malissa Hippo, CNM

## 2011-11-23 LAB — PAP IG W/ RFLX HPV ASCU

## 2011-11-24 LAB — HUMAN PAPILLOMAVIRUS, HIGH RISK: HPV DNA High Risk: NOT DETECTED

## 2011-11-25 ENCOUNTER — Telehealth: Payer: Self-pay

## 2011-11-25 NOTE — Telephone Encounter (Signed)
TC TO PT REGARDING PAP. INFORMED PT THAT PAP WAS ABNORMAL AND PER SL PT NEED COLPOSCOPY. WENT OVER COLPO PROTOCOL WITH PT AND SCHEDULED PT WITH AVS ON 12-16-11 AT 10:15. PT VOICED UNDERSTANDING.

## 2011-12-01 ENCOUNTER — Other Ambulatory Visit: Payer: Self-pay | Admitting: Family Medicine

## 2011-12-01 DIAGNOSIS — R109 Unspecified abdominal pain: Secondary | ICD-10-CM

## 2011-12-03 ENCOUNTER — Ambulatory Visit
Admission: RE | Admit: 2011-12-03 | Discharge: 2011-12-03 | Disposition: A | Discharge status: Home / Self Care | Payer: BC Managed Care – PPO | Source: Ambulatory Visit | Attending: Family Medicine | Admitting: Family Medicine

## 2011-12-03 DIAGNOSIS — R109 Unspecified abdominal pain: Secondary | ICD-10-CM

## 2011-12-03 MED ORDER — IOHEXOL 300 MG/ML  SOLN
100.0000 mL | Freq: Once | INTRAMUSCULAR | Status: AC | PRN
  Administered 2011-12-03: 100 mL via INTRAVENOUS

## 2011-12-16 ENCOUNTER — Encounter: Payer: Self-pay | Admitting: Obstetrics and Gynecology

## 2011-12-16 ENCOUNTER — Ambulatory Visit (INDEPENDENT_AMBULATORY_CARE_PROVIDER_SITE_OTHER): Payer: BC Managed Care – PPO | Admitting: Obstetrics and Gynecology

## 2011-12-16 VITALS — BP 122/82 | Wt 166.0 lb

## 2011-12-16 DIAGNOSIS — R6889 Other general symptoms and signs: Secondary | ICD-10-CM

## 2011-12-16 DIAGNOSIS — D649 Anemia, unspecified: Secondary | ICD-10-CM

## 2011-12-16 DIAGNOSIS — IMO0001 Reserved for inherently not codable concepts without codable children: Secondary | ICD-10-CM

## 2011-12-16 MED ORDER — IRON 325 (65 FE) MG PO TABS: 65.0000 mg | ORAL_TABLET | Freq: Two times a day (BID) | ORAL | Status: DC

## 2011-12-16 NOTE — Progress Notes (Signed)
HISTORY OF PRESENT ILLNESS  [name] is a 36 y.o. year old female,[id number], who presents for a problem visit. The patient has a past history of CIN-3 and she has had a conization in the past.  Her most recent Pap smear showed ASCUS with negative HPV.  The patient has a history of anemia and she needs a refill on her iron.  Subjective:  The patient continues to have abdominal pain.  She is being evaluated by her primary physician.  Objective:  BP 122/82  Wt 166 lb (75.297 kg)  LMP 12/02/2011   General: no distress GI: soft, non-tender; no masses,  no organomegaly and soft and nontender  External genitalia: normal general appearance Vaginal: normal without tenderness, induration or masses and relaxation noted Cervix: see colposcopy note Adnexa: normal bimanual exam Uterus: nontender, normal size  Urine pregnancy test: Negative  COLPOSCOPY NOTE:  The colposcopy procedure was explained.  The patient's questions were answered. A speculum exam was performed.  The cervix was prepped with acetic acid and Hurricaine gel.  The cervix was evaluated using a white light and the green filter. Findings: no lesions seen .  The endocervical canal was clear.  No lesions were seen.  Biopsies obtained: none.  Hemostasis was adequate.  An endocervical curettage was performed.  Again, hemostasis was adequate. The procedure was terminated.  The patient tolerated her procedure well.  The specimens were sent to pathology.  Assessment:  Status post conization of the cervix for CIN-3.  Most recent Pap showed ASCUS with negative HPV.  Anemia  Plan:  Colposcopy performed today but no lesion seen.  Patient not charged.  Iron 325 mg twice a day  Return to office in 11 month(s).   Leonard Schwartz M.D.  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

## 2012-06-03 ENCOUNTER — Ambulatory Visit (INDEPENDENT_AMBULATORY_CARE_PROVIDER_SITE_OTHER): Payer: Self-pay | Admitting: Family Medicine

## 2012-06-03 ENCOUNTER — Encounter: Payer: Self-pay | Admitting: Family Medicine

## 2012-06-03 VITALS — BP 150/100 | HR 92 | Temp 98.8°F | Resp 16 | Wt 172.0 lb

## 2012-06-03 DIAGNOSIS — M545 Low back pain, unspecified: Secondary | ICD-10-CM

## 2012-06-03 DIAGNOSIS — D649 Anemia, unspecified: Secondary | ICD-10-CM

## 2012-06-03 MED ORDER — ESOMEPRAZOLE MAGNESIUM 40 MG PO CPDR: 40.0000 mg | DELAYED_RELEASE_CAPSULE | Freq: Two times a day (BID) | ORAL | Status: DC

## 2012-06-03 MED ORDER — AMOXICILLIN-POT CLAVULANATE 875-125 MG PO TABS: 1.0000 | ORAL_TABLET | Freq: Two times a day (BID) | ORAL | Status: DC

## 2012-06-03 MED ORDER — IRON 325 (65 FE) MG PO TABS: 65.0000 mg | ORAL_TABLET | Freq: Two times a day (BID) | ORAL | Status: DC

## 2012-06-03 NOTE — Progress Notes (Signed)
Subjective:     Patient ID: Karina [name], female   DOB: [birth year], 37 y.o.   MRN: [id number]  HPI  7 days of bilateral maxillary sinus pressure, pain, congestion, rhinorrhea, and fevers.  She reports pain in her teeth, sinus headaches postnasal drip and cough.   Past Medical History  Diagnosis Date  . Anemia   . GERD (gastroesophageal reflux disease)   . Anxiety   . Sinus infection   . Headache   . Vitamin D deficiency   . Palpitations   . Ovarian cyst   . Abnormal Pap smear    Current Outpatient Prescriptions on File Prior to Visit  Medication Sig Dispense Refill  . NON FORMULARY Q Nasal Spray Daily       No current facility-administered medications on file prior to visit.    Review of Systems  Constitutional: Positive for fever, chills and fatigue.  HENT: Positive for congestion, rhinorrhea and postnasal drip. Negative for ear pain, nosebleeds, facial swelling, neck pain and neck stiffness.   Respiratory: Positive for cough. Negative for wheezing and stridor.   Cardiovascular: Negative.   Gastrointestinal: Negative.        Objective:   Physical Exam  Constitutional: She appears well-developed and well-nourished.  HENT:  Head: Normocephalic and atraumatic.  Right Ear: External ear normal.  Left Ear: External ear normal.  Nose: Mucosal edema and rhinorrhea present. Right sinus exhibits maxillary sinus tenderness and frontal sinus tenderness. Left sinus exhibits maxillary sinus tenderness and frontal sinus tenderness.  Eyes: Conjunctivae and EOM are normal. Pupils are equal, round, and reactive to light.  Neck: Neck supple. No thyromegaly present.  Cardiovascular: Normal rate, regular rhythm and normal heart sounds.   Pulmonary/Chest: Effort normal and breath sounds normal.       Assessment:     Sinusitis Low-back pain    Plan:    Augmentin 875 mg by mouth twice a day quantity sufficient for 10 days. Consult PT regarding low back.  LS-spine x-ray obtained in  August of 2013 was normal.

## 2012-06-27 ENCOUNTER — Ambulatory Visit: Payer: BC Managed Care – PPO | Attending: Family Medicine | Admitting: Physical Therapy

## 2012-06-27 ENCOUNTER — Encounter: Payer: Self-pay | Admitting: Physical Therapy

## 2013-05-31 ENCOUNTER — Other Ambulatory Visit: Payer: Self-pay | Admitting: Family Medicine

## 2013-05-31 DIAGNOSIS — Z0000 Encounter for general adult medical examination without abnormal findings: Secondary | ICD-10-CM

## 2013-06-16 ENCOUNTER — Encounter: Payer: BC Managed Care – PPO | Admitting: Family Medicine

## 2014-01-08 ENCOUNTER — Encounter: Payer: Self-pay | Admitting: Family Medicine

## 2014-01-22 ENCOUNTER — Other Ambulatory Visit: Payer: Self-pay | Admitting: Obstetrics and Gynecology

## 2014-01-22 DIAGNOSIS — Z1231 Encounter for screening mammogram for malignant neoplasm of breast: Secondary | ICD-10-CM

## 2014-02-07 ENCOUNTER — Telehealth: Payer: Self-pay | Admitting: Family Medicine

## 2014-02-09 ENCOUNTER — Encounter: Payer: Self-pay | Admitting: Family Medicine

## 2014-02-09 ENCOUNTER — Ambulatory Visit (INDEPENDENT_AMBULATORY_CARE_PROVIDER_SITE_OTHER): Payer: BC Managed Care – PPO | Admitting: Family Medicine

## 2014-02-09 VITALS — BP 122/60 | HR 76 | Temp 98.2°F | Resp 14 | Ht 65.0 in | Wt 172.0 lb

## 2014-02-09 DIAGNOSIS — Z720 Tobacco use: Secondary | ICD-10-CM

## 2014-02-09 DIAGNOSIS — R1013 Epigastric pain: Secondary | ICD-10-CM

## 2014-02-09 DIAGNOSIS — R3129 Other microscopic hematuria: Secondary | ICD-10-CM

## 2014-02-09 DIAGNOSIS — F430 Acute stress reaction: Secondary | ICD-10-CM

## 2014-02-09 DIAGNOSIS — F17200 Nicotine dependence, unspecified, uncomplicated: Secondary | ICD-10-CM

## 2014-02-09 DIAGNOSIS — R312 Other microscopic hematuria: Secondary | ICD-10-CM

## 2014-02-09 DIAGNOSIS — K219 Gastro-esophageal reflux disease without esophagitis: Secondary | ICD-10-CM

## 2014-02-09 DIAGNOSIS — R5383 Other fatigue: Secondary | ICD-10-CM

## 2014-02-09 LAB — URINALYSIS, MICROSCOPIC ONLY: Crystals: NONE SEEN

## 2014-02-09 LAB — URINALYSIS, ROUTINE W REFLEX MICROSCOPIC
Bilirubin Urine: NEGATIVE
Glucose, UA: NEGATIVE mg/dL
Ketones, ur: NEGATIVE mg/dL
Leukocytes, UA: NEGATIVE
Nitrite: NEGATIVE
Protein, ur: NEGATIVE mg/dL
Specific Gravity, Urine: >1.03 — ABNORMAL HIGH (ref 1.005–1.030)
Urobilinogen, UA: 0.2 mg/dL (ref 0.0–1.0)
pH: 6 (ref 5.0–8.0)

## 2014-02-09 LAB — VITAMIN B12: Vitamin B-12: 308 pg/mL (ref 211–911)

## 2014-02-09 NOTE — Progress Notes (Signed)
Patient ID: Karina DandyStacy D Bursch, female   DOB: [birth year], 38 y.o.   MRN: [id number]   Subjective:    Patient ID: Karina [name], female    DOB: [birth year], 38 y.o.   MRN: [id number]  Patient presents for Discuss GYN Lab Results  patient here to follow GYN labs. She went in to have blood work done by her GYN because she is having a few months of chest rest as well as strange abdominal symptoms fatigue. There's been a lot of stress between her and her husband they're currently in therapy but she is not on any antidepressant medication. Her labs were reviewed and showed a minimally low vitamin D which she will start on vitamin D replacement this week she also had an upper limits testosterone level her thyroid was normal she has history of iron deficiency anemia her hemoglobin was normal her platelets were normal her metabolic panel was normal and her other hormone labs were normal. She continues to have some epigastric pain on and off she does not take her Nexium on a regular basis she was told that she may have ulcers in the past. She typically gets pain after she eats though this is a side note to the fatigue that she had been experiencing recently. He did have a urinalysis done which showed some blood in the urine and she is a smoker. Her menstrual cycle is still monthly but irregular   Review Of Systems:  GEN- denies fatigue, fever, weight loss,weakness, recent illness HEENT- denies eye drainage, change in vision, nasal discharge, CVS- denies chest pain, palpitations RESP- denies SOB, cough, wheeze ABD- denies N/V, change in stools,+ abd pain GU- denies dysuria, hematuria, dribbling, incontinence MSK- denies joint pain, muscle aches, injury Neuro- denies headache, dizziness, syncope, seizure activity       Objective:    BP 122/60 mmHg  Pulse 76  Temp(Src) 98.2 F (36.8 C) (Oral)  Resp 14  Ht 5\' 5"  (1.651 m)  Wt 172 lb (78.019 kg)  BMI 28.62 kg/m2  LMP 02/05/2014 (Approximate) GEN- NAD,  alert and oriented x3 HEENT- PERRL, EOMI, non injected sclera, pink conjunctiva, MMM, oropharynx clear Neck- Supple, no thyromegaly CVS- RRR, no murmur RESP-CTAB ABD-NABS,soft,mild TTP epigastrium,ND, no CVA tenderness EXT- No edema Psych- normal affect and mood Pulses- Radial 2+        Assessment & Plan:      Problem List Items Addressed This Visit    GERD (gastroesophageal reflux disease)   Abdominal pain   Relevant Orders      H. pylori breath test    Other Visit Diagnoses    Microscopic hematuria    -  Primary    Recheck UA she is a smoker may need urology evaluation pending other bloodwork    Relevant Orders       Urinalysis, Routine w reflex microscopic (Completed)    Other fatigue        Discussed regular exercise, she is also seeing therapist, will hold on any other meds    Relevant Orders       Vitamin B12    Stress reaction           Note: This dictation was prepared with Dragon dictation along with smaller phrase technology. Any transcriptional errors that result from this process are unintentional.

## 2014-02-09 NOTE — Assessment & Plan Note (Signed)
Discussed tobacco cessation 

## 2014-02-09 NOTE — Patient Instructions (Addendum)
Try the vitamin B12 over the counter Urea breath test F/U pending results

## 2014-02-12 LAB — H. PYLORI BREATH TEST: H. pylori Breath Test: NOT DETECTED

## 2014-02-15 ENCOUNTER — Ambulatory Visit: Payer: BC Managed Care – PPO

## 2014-02-19 ENCOUNTER — Encounter: Payer: Self-pay | Admitting: Physician Assistant

## 2014-02-19 ENCOUNTER — Ambulatory Visit (INDEPENDENT_AMBULATORY_CARE_PROVIDER_SITE_OTHER): Payer: BC Managed Care – PPO | Admitting: Physician Assistant

## 2014-02-19 VITALS — BP 124/88 | HR 88 | Temp 98.8°F | Resp 18 | Wt 172.0 lb

## 2014-02-19 DIAGNOSIS — J988 Other specified respiratory disorders: Secondary | ICD-10-CM

## 2014-02-19 DIAGNOSIS — B9689 Other specified bacterial agents as the cause of diseases classified elsewhere: Secondary | ICD-10-CM

## 2014-02-19 MED ORDER — AZITHROMYCIN 250 MG PO TABS: ORAL_TABLET | ORAL | Status: DC

## 2014-02-19 NOTE — Progress Notes (Signed)
    Patient ID: Karina DandyStacy [name] Sedlack MRN: [id number], DOB: [birth year], 38 y.o. Date of Encounter: 02/19/2014, 2:31 PM    Chief Complaint:  Chief Complaint  Patient presents with  . c/o sinus infection    green secretions     HPI: 38 y.o. year old white female is that she has been sick for about 7 days now. Is that she has head and nasal congestion with dark mucus. Is that she also has chest congestion with cough. No significant sore throat or ear ache. No fevers or chills. Using over-the-counter Mucinex and decongestants without much relief.     Home Meds:   Outpatient Prescriptions Prior to Visit  Medication Sig Dispense Refill  . esomeprazole (NEXIUM) 40 MG capsule Take 1 capsule (40 mg total) by mouth 2 (two) times daily. 180 capsule 1  . Ferrous Sulfate (IRON) 325 (65 FE) MG TABS Take 65 mg by mouth 2 (two) times daily. 180 each 1  . NON FORMULARY Q Nasal Spray Daily    . Vitamin [name], Ergocalciferol, (DRISDOL) 50000 UNITS CAPS capsule Take 50,000 Units by mouth every 7 (seven) days.     No facility-administered medications prior to visit.    Allergies:  Allergies  Allergen Reactions  . Prednisone     "jitters"      Review of Systems: See HPI for pertinent ROS. All other ROS negative.    Physical Exam: Blood pressure 124/88, pulse 88, temperature 98.8 F (37.1 C), temperature source Oral, resp. rate 18, weight 172 lb (78.019 kg), last menstrual period 02/05/2014., Body mass index is 28.62 kg/(m^2). General:  WNWD WF. Appears in no acute distress. HEENT: Normocephalic, atraumatic, eyes without discharge, sclera non-icteric, nares are without discharge. Bilateral auditory canals clear, TM's are dull.  Oral cavity moist, posterior pharynx without exudate, erythema, peritonsillar abscess. Right Posterior Pharynx--localized area of erythema ulcer approx 3 mm.   Neck: Supple. No thyromegaly. No lymphadenopathy. No enlarged lymph nodes and no tender lymph nodes. Lungs: Clear  bilaterally to auscultation without wheezes, rales, or rhonchi. Breathing is unlabored. Heart: Regular rhythm. No murmurs, rubs, or gallops. Msk:  Strength and tone normal for age. Extremities/Skin: Warm and dry.  No rashes. Neuro: Alert and oriented X 3. Moves all extremities spontaneously. Gait is normal. CNII-XII grossly in tact. Psych:  Responds to questions appropriately with a normal affect.     ASSESSMENT AND PLAN:  38 y.o. year old female with  1. Bacterial respiratory infection - azithromycin (ZITHROMAX) 250 MG tablet; Day 1: Take 2 daily.  Days 2-5: Take 1 daily.  Dispense: 6 tablet; Refill: 0 Complete antibiotics as directed. Continue expectorant and decongestant as needed for symptom relief. Follow-up if symptoms do not resolve within 1 week after completion of antibiotic.  [address] Glen Creek [address]igned, Kirstina Leinweber Beth WyomingDixon, [place], The Center For Special SurgeryBSFM 02/19/2014 2:31 PM

## 2014-02-20 ENCOUNTER — Encounter: Payer: Self-pay | Admitting: Family Medicine

## 2014-03-12 ENCOUNTER — Encounter (INDEPENDENT_AMBULATORY_CARE_PROVIDER_SITE_OTHER): Payer: Self-pay

## 2014-03-12 ENCOUNTER — Ambulatory Visit
Admission: RE | Admit: 2014-03-12 | Discharge: 2014-03-12 | Disposition: A | Discharge status: Home / Self Care | Payer: BLUE CROSS/BLUE SHIELD | Source: Ambulatory Visit | Attending: Obstetrics and Gynecology | Admitting: Obstetrics and Gynecology

## 2014-03-12 DIAGNOSIS — Z1231 Encounter for screening mammogram for malignant neoplasm of breast: Secondary | ICD-10-CM

## 2014-06-07 ENCOUNTER — Telehealth: Payer: Self-pay | Admitting: Family Medicine

## 2014-06-07 MED ORDER — BECLOMETHASONE DIPROPIONATE 80 MCG/ACT NA AERS: 2.0000 | INHALATION_SPRAY | Freq: Every day | NASAL | Status: DC

## 2014-06-07 NOTE — Telephone Encounter (Signed)
[phone]  PT is needing a refill on Qnasl 80 mcg per spray ( was prescribed Childrens but needs to be adults) [facility]

## 2014-06-07 NOTE — Telephone Encounter (Signed)
Prescription sent to pharmacy.

## 2015-02-26 ENCOUNTER — Encounter: Payer: Self-pay | Admitting: Family Medicine

## 2015-02-26 ENCOUNTER — Ambulatory Visit (INDEPENDENT_AMBULATORY_CARE_PROVIDER_SITE_OTHER): Payer: BLUE CROSS/BLUE SHIELD | Admitting: Family Medicine

## 2015-02-26 VITALS — BP 110/68 | HR 98 | Temp 98.9°F | Resp 18 | Wt 173.0 lb

## 2015-02-26 DIAGNOSIS — J019 Acute sinusitis, unspecified: Secondary | ICD-10-CM | POA: Diagnosis not present

## 2015-02-26 MED ORDER — CEFDINIR 300 MG PO CAPS: 300.0000 mg | ORAL_CAPSULE | Freq: Two times a day (BID) | ORAL | Status: DC

## 2015-02-26 NOTE — Progress Notes (Signed)
Subjective:    Patient ID: Karina [name], female    DOB: 1975/05/29, 39 y.o.   MRN: [id number]  HPI Symptoms began more than a week ago. Now she has severe head congestion. She reports pain in both ears. 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 The left tympanic membrane is extremely erythematous. 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 She reports pressure and pain in her frontal and maxillary sinuses bilaterally. She also reports postnasal drip and a cough productive of green thick mucus. She denies any shortness of breath or chest pain or pleurisy Past Medical History  Diagnosis Date  . Anemia   . GERD (gastroesophageal reflux disease)   . Anxiety   . Sinus infection   . Headache(784.0)   . Vitamin D deficiency   . Palpitations   . Ovarian cyst   . Abnormal Pap smear    Past Surgical History  Procedure Laterality Date  . Ectopic pregnancy surgery      ruptured  . Tonsillectomy    . US echocardiography  08-30-2007    EF 60-65%  . Cervical cone biopsy     Current Outpatient Prescriptions on File Prior to Visit  Medication Sig Dispense Refill  . Beclomethasone Dipropionate 80 MCG/ACT AERS Place 2 sprays into the nose daily. 8.7 g 11  . esomeprazole (NEXIUM) 40 MG capsule Take 1 capsule (40 mg total) by mouth 2 (two) times daily. 180 capsule 1  . Ferrous Sulfate (IRON) 325 (65 FE) MG TABS Take 65 mg by mouth 2 (two) times daily. 180 each 1  . Vitamin D, Ergocalciferol, (DRISDOL) 50000 UNITS CAPS capsule Take 50,000 Units by mouth every 7 (seven) days.     No current facility-administered medications on file prior to visit.   Allergies  Allergen Reactions  . Prednisone     "jitters"   Social History   Social History  . Marital Status: Married    Spouse Name: N/A  . Number of Children: N/A  . Years of Education: N/A   Occupational History  . Not on file.   Social History Main Topics  . Smoking status: Current  Every Day Smoker  . Smokeless tobacco: Not on file  . Alcohol Use: Yes     Comment: Daily  . Drug Use: Not on file  . Sexual Activity: Yes    Birth Control/ Protection: None   Other Topics Concern  . Not on file   Social History Narrative      Review of Systems  All other systems reviewed and are negative.      Objective:   Physical Exam  Constitutional: She appears well-developed and well-nourished.  HENT:  Right Ear: External ear normal. Tympanic membrane is erythematous and bulging. A middle ear effusion is present.  Left Ear: Tympanic membrane is erythematous and bulging. A middle ear effusion is present.  Nose: Mucosal edema and rhinorrhea present. Right sinus exhibits maxillary sinus tenderness and frontal sinus tenderness. Left sinus exhibits maxillary sinus tenderness and frontal sinus tenderness.  Mouth/Throat: Oropharynx is clear and moist. No oropharyngeal exudate.  Neck: Neck supple.  Cardiovascular: Normal rate, regular rhythm and normal heart sounds.   Pulmonary/Chest: Effort normal and breath sounds normal. No respiratory distress. She has no wheezes. She has no rales. She exhibits no tenderness.  Lymphadenopathy:    She has no cervical adenopathy.  Vitals reviewed.         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 Begin Omnicef 300 mg by mouth twice a day for 10 days. Use nasal saline rinses 4 times a day. Use Mucinex DM for cough and chest congestion.

## 2015-03-13 ENCOUNTER — Other Ambulatory Visit: Payer: Self-pay | Admitting: Family Medicine

## 2015-08-27 ENCOUNTER — Ambulatory Visit (INDEPENDENT_AMBULATORY_CARE_PROVIDER_SITE_OTHER): Payer: BLUE CROSS/BLUE SHIELD | Admitting: Family Medicine

## 2015-08-27 ENCOUNTER — Encounter: Payer: Self-pay | Admitting: Family Medicine

## 2015-08-27 VITALS — BP 136/70 | HR 78 | Temp 98.2°F | Resp 14 | Ht 66.0 in | Wt 176.0 lb

## 2015-08-27 DIAGNOSIS — F429 Obsessive-compulsive disorder, unspecified: Secondary | ICD-10-CM | POA: Insufficient documentation

## 2015-08-27 DIAGNOSIS — R5383 Other fatigue: Secondary | ICD-10-CM | POA: Diagnosis not present

## 2015-08-27 DIAGNOSIS — Z13220 Encounter for screening for lipoid disorders: Secondary | ICD-10-CM

## 2015-08-27 DIAGNOSIS — F329 Major depressive disorder, single episode, unspecified: Secondary | ICD-10-CM

## 2015-08-27 DIAGNOSIS — E559 Vitamin D deficiency, unspecified: Secondary | ICD-10-CM | POA: Diagnosis not present

## 2015-08-27 DIAGNOSIS — F411 Generalized anxiety disorder: Secondary | ICD-10-CM

## 2015-08-27 DIAGNOSIS — F32A Depression, unspecified: Secondary | ICD-10-CM

## 2015-08-27 MED ORDER — ALPRAZOLAM 0.25 MG PO TABS: 0.2500 mg | ORAL_TABLET | Freq: Two times a day (BID) | ORAL | Status: DC | PRN

## 2015-08-27 MED ORDER — DESVENLAFAXINE SUCCINATE ER 50 MG PO TB24: 50.0000 mg | ORAL_TABLET | Freq: Every day | ORAL | Status: DC

## 2015-08-27 NOTE — Assessment & Plan Note (Signed)
My partner spoke with the psychologist as he is listed as the PCP. They are concerned for generalized anxiety disorder as well as OCD tendencies. We will start Pristiq once a day. She also has an underlying depression. She does not want to use the Klonopin therefore I've given her xanax0.25 mg twice a day as needed as a bridge. She will follow with her therapist tomorrow. She will come back in the office in 4 weeks to follow-up medications. Fasting labs to be done

## 2015-08-27 NOTE — Progress Notes (Signed)
Patient ID: Karina DandyStacy D [name], female   DOB: August 17, 1975, 40 y.o.   MRN: [id number]   Subjective:    Patient ID: Karina [name], female    DOB: August 17, 1975, 40 y.o.   MRN: [id number]  Patient presents for F/U and Medication Management She here discussed medications. She has been somewhat separated from her husband for the past year. They're having a lot of marital issues she has been having increased anxiety throughout their marriage. She actually has a history of generalized anxiety and depression the past year she was on Paxil and Klonopin as a teenager and into her 5020s. She did not like the way to the Paxil made her feel therefore she went off the medication. She's not been on any other meds. Has had occasional use of Xanax in the past. She is now following [facility] [place]. Her therapist has been seeing her for the past 5 months she's had some marital sessions as well as individual sessions. Initially she was adverse to medications but now they have decided that she would benefit from medication. Her therapist and the team there have recommended Pristiq and we will also tell Klonopin 3 times a day however she does not want to do the Klonopin states that that is not what they had discussed. She is willing to go on the Pristiq. She will like to also have a complete panel of blood work prior to starting medications.    Review Of Systems:  GEN- +fatigue, fever, weight loss,weakness, recent illness HEENT- denies eye drainage, change in vision, nasal discharge, CVS- denies chest pain, palpitations RESP- denies SOB, cough, wheeze ABD- denies N/V, change in stools, abd pain GU- denies dysuria, hematuria, dribbling, incontinence MSK- denies joint pain, muscle aches, injury Neuro- denies headache, dizziness, syncope, seizure activity       Objective:    BP 136/70 mmHg  Pulse 78  Temp(Src) 98.2 F (36.8 C) (Oral)  Resp 14  Ht 5\' 6"  (1.676 m)  Wt 176 lb  (79.833 kg)  BMI 28.42 kg/m2  LMP 08/25/2015 GEN- NAD, alert and oriented x3 Psych- anxious and stressed appearing , no SI, well groomed, normal speech, good eye contact         Assessment & Plan:      Problem List Items Addressed This Visit    OCD (obsessive compulsive disorder)   GAD (generalized anxiety disorder) - Primary    My partner spoke with the psychologist as he is listed as the PCP. They are concerned for generalized anxiety disorder as well as OCD tendencies. We will start Pristiq once a day. She also has an underlying depression. She does not want to use the Klonopin therefore I've given her xanax0.25 mg twice a day as needed as a bridge. She will follow with her therapist tomorrow. She will come back in the office in 4 weeks to follow-up medications. Fasting labs to be done      Depression   Relevant Medications   ALPRAZolam (XANAX) 0.25 MG tablet   desvenlafaxine (PRISTIQ) 50 MG 24 hr tablet      Note: This dictation was prepared with Dragon dictation along with smaller phrase technology. Any transcriptional errors that result from this process are unintentional.

## 2015-08-27 NOTE — Patient Instructions (Addendum)
Return for fasting labs in the morning  I will call you back with the name of the medication F/U 4 weeks

## 2015-08-28 ENCOUNTER — Other Ambulatory Visit: Payer: BLUE CROSS/BLUE SHIELD

## 2015-08-28 DIAGNOSIS — E559 Vitamin D deficiency, unspecified: Secondary | ICD-10-CM | POA: Diagnosis not present

## 2015-08-28 DIAGNOSIS — Z13220 Encounter for screening for lipoid disorders: Secondary | ICD-10-CM | POA: Diagnosis not present

## 2015-08-28 DIAGNOSIS — R5383 Other fatigue: Secondary | ICD-10-CM

## 2015-08-28 LAB — CBC WITH DIFFERENTIAL/PLATELET
Basophils Absolute: 0 cells/uL (ref 0–200)
Basophils Relative: 0 %
Eosinophils Absolute: 107 cells/uL (ref 15–500)
Eosinophils Relative: 1 %
HCT: 39.7 % (ref 35.0–45.0)
Hemoglobin: 13.4 g/dL (ref 12.0–15.0)
Lymphocytes Relative: 24 %
Lymphs Abs: 2568 cells/uL (ref 850–3900)
MCH: 33.2 pg — ABNORMAL HIGH (ref 27.0–33.0)
MCHC: 33.8 g/dL (ref 32.0–36.0)
MCV: 98.3 fL (ref 80.0–100.0)
MPV: 9 fL (ref 7.5–12.5)
Monocytes Absolute: 749 cells/uL (ref 200–950)
Monocytes Relative: 7 %
Neutro Abs: 7276 cells/uL (ref 1500–7800)
Neutrophils Relative %: 68 %
Platelets: 253 10*3/uL (ref 140–400)
RBC: 4.04 MIL/uL (ref 3.80–5.10)
RDW: 13.4 % (ref 11.0–15.0)
WBC: 10.7 10*3/uL (ref 3.8–10.8)

## 2015-08-28 LAB — COMPREHENSIVE METABOLIC PANEL
ALT: 9 U/L (ref 6–29)
AST: 15 U/L (ref 10–30)
Albumin: 4 g/dL (ref 3.6–5.1)
Alkaline Phosphatase: 55 U/L (ref 33–115)
BUN: 18 mg/dL (ref 7–25)
CO2: 27 mmol/L (ref 20–31)
Calcium: 9 mg/dL (ref 8.6–10.2)
Chloride: 105 mmol/L (ref 98–110)
Creat: 0.67 mg/dL (ref 0.50–1.10)
Glucose, Bld: 79 mg/dL (ref 70–99)
Potassium: 4.5 mmol/L (ref 3.5–5.3)
Sodium: 141 mmol/L (ref 135–146)
Total Bilirubin: 0.5 mg/dL (ref 0.2–1.2)
Total Protein: 6.2 g/dL (ref 6.1–8.1)

## 2015-08-28 LAB — TSH: TSH: 3.02 mIU/L

## 2015-08-28 LAB — LIPID PANEL
Cholesterol: 177 mg/dL (ref 125–200)
HDL: 47 mg/dL (ref >=46)
LDL Cholesterol: 94 mg/dL (ref <130)
Total CHOL/HDL Ratio: 3.8 Ratio (ref <=5.0)
Triglycerides: 180 mg/dL — ABNORMAL HIGH (ref <150)
VLDL: 36 mg/dL — ABNORMAL HIGH (ref <30)

## 2015-08-29 LAB — VITAMIN D 25 HYDROXY (VIT D DEFICIENCY, FRACTURES): Vit D, 25-Hydroxy: 33 ng/mL (ref 30–100)

## 2015-09-06 ENCOUNTER — Encounter: Payer: Self-pay | Admitting: *Deleted

## 2015-09-24 ENCOUNTER — Ambulatory Visit: Payer: BLUE CROSS/BLUE SHIELD | Admitting: Family Medicine

## 2015-11-11 ENCOUNTER — Other Ambulatory Visit: Payer: Self-pay | Admitting: Family Medicine

## 2015-12-08 ENCOUNTER — Other Ambulatory Visit: Payer: Self-pay | Admitting: Family Medicine

## 2015-12-11 ENCOUNTER — Other Ambulatory Visit: Payer: Self-pay | Admitting: Family Medicine

## 2015-12-11 MED ORDER — ESOMEPRAZOLE MAGNESIUM 40 MG PO CPDR: 40.0000 mg | DELAYED_RELEASE_CAPSULE | Freq: Two times a day (BID) | ORAL | 1 refills | Status: DC

## 2016-01-03 ENCOUNTER — Encounter: Payer: Self-pay | Admitting: Family Medicine

## 2016-01-03 ENCOUNTER — Ambulatory Visit (INDEPENDENT_AMBULATORY_CARE_PROVIDER_SITE_OTHER): Payer: BLUE CROSS/BLUE SHIELD | Admitting: Family Medicine

## 2016-01-03 VITALS — BP 138/88 | HR 90 | Temp 98.1°F | Resp 16 | Wt 176.0 lb

## 2016-01-03 DIAGNOSIS — F411 Generalized anxiety disorder: Secondary | ICD-10-CM

## 2016-01-03 DIAGNOSIS — Z13220 Encounter for screening for lipoid disorders: Secondary | ICD-10-CM

## 2016-01-03 DIAGNOSIS — F331 Major depressive disorder, recurrent, moderate: Secondary | ICD-10-CM | POA: Diagnosis not present

## 2016-01-03 MED ORDER — VORTIOXETINE HBR 5 MG PO TABS: ORAL_TABLET | ORAL | 3 refills | Status: DC

## 2016-01-03 MED ORDER — ALPRAZOLAM 0.25 MG PO TABS: 0.2500 mg | ORAL_TABLET | Freq: Two times a day (BID) | ORAL | 1 refills | Status: DC | PRN
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
## 2016-01-03 NOTE — Patient Instructions (Signed)
Trintillex start at bedtime Take the xanax as prescribed Call [facility]- [phone] Or [facility]- [phone] F/U 4 weeks for recheck

## 2016-02-04 ENCOUNTER — Ambulatory Visit: Payer: BLUE CROSS/BLUE SHIELD | Admitting: Family Medicine

## 2016-03-17 DIAGNOSIS — R319 Hematuria, unspecified: Secondary | ICD-10-CM | POA: Diagnosis not present

## 2016-03-17 DIAGNOSIS — R102 Pelvic and perineal pain: Secondary | ICD-10-CM | POA: Diagnosis not present

## 2016-04-23 ENCOUNTER — Encounter: Payer: Self-pay | Admitting: Family Medicine

## 2016-04-23 ENCOUNTER — Ambulatory Visit (INDEPENDENT_AMBULATORY_CARE_PROVIDER_SITE_OTHER): Payer: BLUE CROSS/BLUE SHIELD | Admitting: Family Medicine

## 2016-04-23 VITALS — BP 130/72 | HR 66 | Temp 98.6°F | Resp 18 | Ht 66.0 in | Wt 180.0 lb

## 2016-04-23 DIAGNOSIS — B029 Zoster without complications: Secondary | ICD-10-CM

## 2016-04-23 NOTE — Patient Instructions (Signed)
Continue the cortisone  F/U as needed

## 2016-04-23 NOTE — Progress Notes (Signed)
   Subjective:    Patient ID: Karina [name], female    DOB: [birth year], 41 y.o.   MRN: [id number]  Patient presents for Rash (x1 week- irritation to upper buttocks on L side)   Pt here with rash to left side of upper buttocks for the past week. Has mild tingling and itching. Pain has subsided. Used hydrocortisone cream to the area which has helped. She has not had any further lesions. It is less red now. Feels well, no pain in back. No change in bowel or bladder    She does state she and husband have reconciled. She has joined a gym and is starting to get out of the house  Review Of Systems:  GEN- denies fatigue, fever, weight loss,weakness, recent illness HEENT- denies eye drainage, change in vision, nasal discharge, CVS- denies chest pain, palpitations RESP- denies SOB, cough, wheeze ABD- denies N/V, change in stools, abd pain GU- denies dysuria, hematuria, dribbling, incontinence MSK- denies joint pain, muscle aches, injury Neuro- denies headache, dizziness, syncope, seizure activity       Objective:    BP 130/72   Pulse 66   Temp 98.6 F (37 C) (Oral)   Resp 18   Ht 5\' 6"  (1.676 m)   Wt 180 lb (81.6 kg)   SpO2 98%   BMI 29.05 kg/m  GEN- NAD, alert and oriented x3 Skin- upper left buttocks patch of erythema with 5 maculopapular lesions with scabs at center/some appear to be old blister like lesions, mild TTP         Assessment & Plan:      Problem List Items Addressed This Visit    None    Visit Diagnoses    Herpes zoster without complication    -  Primary   Based on apperance more shingles but present for 1 week and improving. Discussed valtrex may not give much benefit in this time frame. She can continue to cortisone, not real pain, no sign of superinfection      Note: This dictation was prepared with Dragon dictation along with smaller phrase technology. Any transcriptional errors that result from this process are unintentional.

## 2016-04-24 ENCOUNTER — Encounter: Payer: Self-pay | Admitting: Family Medicine

## 2016-05-01 DIAGNOSIS — Z1231 Encounter for screening mammogram for malignant neoplasm of breast: Secondary | ICD-10-CM | POA: Diagnosis not present

## 2016-05-26 DIAGNOSIS — H5213 Myopia, bilateral: Secondary | ICD-10-CM | POA: Diagnosis not present

## 2016-05-26 DIAGNOSIS — H52203 Unspecified astigmatism, bilateral: Secondary | ICD-10-CM | POA: Diagnosis not present

## 2016-06-11 ENCOUNTER — Other Ambulatory Visit: Payer: Self-pay | Admitting: Family Medicine

## 2016-06-11 NOTE — Telephone Encounter (Signed)
Refill appropriate 

## 2016-07-13 ENCOUNTER — Encounter: Payer: Self-pay | Admitting: Family Medicine

## 2016-07-13 ENCOUNTER — Ambulatory Visit (INDEPENDENT_AMBULATORY_CARE_PROVIDER_SITE_OTHER): Payer: BLUE CROSS/BLUE SHIELD | Admitting: Family Medicine

## 2016-07-13 VITALS — BP 128/80 | HR 88 | Temp 98.3°F | Resp 16 | Wt 181.4 lb

## 2016-07-13 DIAGNOSIS — H6502 Acute serous otitis media, left ear: Secondary | ICD-10-CM | POA: Diagnosis not present

## 2016-07-13 MED ORDER — AMOXICILLIN 875 MG PO TABS: 875.0000 mg | ORAL_TABLET | Freq: Two times a day (BID) | ORAL | 0 refills | Status: DC

## 2016-07-13 NOTE — Progress Notes (Signed)
   Subjective:    Patient ID: Karina [name], female    DOB: 03-21-1975, 41 y.o.   MRN: [id number]  Patient presents for Otalgia and Sinus Problem  Patient here with left ear pain has had some sinus pressure and mild drainage that started a week ago long with sore throat. Now she only has left ear pain. She's not had any fever she is using her regular allergy medications and feels like that is controlled. She's not had any cough or congestion   Review Of Systems:  GEN- denies fatigue, fever, weight loss,weakness, recent illness HEENT- denies eye drainage, change in vision, nasal discharge, CVS- denies chest pain, palpitations RESP- denies SOB, cough, wheeze ABD- denies N/V, change in stools, abd pain Neuro- denies headache, dizziness, syncope, seizure activity       Objective:    BP 128/80 (BP Location: Left Arm)   Pulse 88   Temp 98.3 F (36.8 C) (Oral)   Resp 16   Wt 181 lb 6.4 oz (82.3 kg)   SpO2 97%   BMI 29.28 kg/m  GEN- NAD, alert and oriented x3 HEENT- PERRL, EOMI, non injected sclera, pink conjunctiva, MMM, oropharynxclear, Right TM clear  no effusion, Left TM is injected with clear fluid No maxillary sinus tendernss Neck- Supple, no LAD CVS- RRR, no murmur RESP-CTAB Pulses- Radial 2+          Assessment & Plan:      Problem List Items Addressed This Visit    None    Visit Diagnoses    Acute serous otitis media of left ear, recurrence not specified    -  Primary   LOM, treat with amoxicillin, ibuprofen for pain   Relevant Medications   amoxicillin (AMOXIL) 875 MG tablet      Note: This dictation was prepared with Dragon dictation along with smaller phrase technology. Any transcriptional errors that result from this process are unintentional.

## 2016-07-13 NOTE — Patient Instructions (Signed)
Take antibiotics  Continue the allergy med  F/U as needed

## 2016-08-08 DIAGNOSIS — K2960 Other gastritis without bleeding: Secondary | ICD-10-CM | POA: Diagnosis not present

## 2016-08-08 DIAGNOSIS — N390 Urinary tract infection, site not specified: Secondary | ICD-10-CM | POA: Diagnosis not present

## 2016-08-23 ENCOUNTER — Encounter (HOSPITAL_COMMUNITY): Payer: Self-pay | Admitting: *Deleted

## 2016-08-23 ENCOUNTER — Ambulatory Visit (HOSPITAL_COMMUNITY)
Admission: EM | Admit: 2016-08-23 | Discharge: 2016-08-23 | Disposition: A | Discharge status: Home / Self Care | Payer: BLUE CROSS/BLUE SHIELD | Attending: Internal Medicine | Admitting: Internal Medicine

## 2016-08-23 DIAGNOSIS — R0981 Nasal congestion: Secondary | ICD-10-CM | POA: Diagnosis not present

## 2016-08-23 DIAGNOSIS — J309 Allergic rhinitis, unspecified: Secondary | ICD-10-CM | POA: Diagnosis not present

## 2016-08-23 DIAGNOSIS — R0982 Postnasal drip: Secondary | ICD-10-CM

## 2016-08-23 DIAGNOSIS — J301 Allergic rhinitis due to pollen: Secondary | ICD-10-CM

## 2016-08-23 MED ORDER — IPRATROPIUM BROMIDE 0.06 % NA SOLN: 2.0000 | Freq: Four times a day (QID) | NASAL | 1 refills | Status: DC

## 2016-08-23 NOTE — Discharge Instructions (Signed)
Start using the Atrovent nasal spray as directed. Continue using your steroid nasal spray as directed also saline nasal spray frequently and even the Netty pot as needed. Sudafed PE 10 mg every 4 to 6 hours as needed for congestion Allegra or Zyrtec daily as needed for drainage and runny nose. For stronger antihistamine may take Chlor-Trimeton 2 to 4 mg every 4 to 6 hours, may cause drowsiness. Saline nasal spray used frequently. Drink plenty of fluids and stay well-hydrated.

## 2016-08-23 NOTE — ED Triage Notes (Signed)
Patient reports 2 day history of headache, nasal congestion, sneezing, and sore throat. Patient reports earache 3 weeks ago and was taking amoxicillin for it then developed UTI, states that she did not take full course of the antibiotic. States that she had amoxicillin left over and took 2 tablets today.

## 2016-08-23 NOTE — ED Provider Notes (Signed)
CSN: [id number]     Arrival date & time 08/23/16  1912 History   First MD Initiated Contact with Patient 08/23/16 2116     Chief Complaint  Patient presents with  . Sore Throat  . Nasal Congestion  . Headache   (Consider location/radiation/quality/duration/timing/severity/associated sxs/prior Treatment) 41 year old female states that yesterday she developed nasal congestion, runny nose, PND sneezing, watery eyes and scratchy throat. Is gotten worse today. She took one dose of NyQuil and 2 doses of amoxicillin and did not get better. She does use beclomethasone nasal spray chronically. No fevers. No chills.      Past Medical History:  Diagnosis Date  . Abnormal Pap smear   . Anemia   . Anxiety   . GERD (gastroesophageal reflux disease)   . Headache(784.0)   . Ovarian cyst   . Palpitations   . Sinus infection   . Vitamin D deficiency    Past Surgical History:  Procedure Laterality Date  . CERVICAL CONE BIOPSY    . ECTOPIC PREGNANCY SURGERY     ruptured  . TONSILLECTOMY    . US ECHOCARDIOGRAPHY  08-30-2007   EF 60-65%   History reviewed. No pertinent family history. Social History  Substance Use Topics  . Smoking status: Current Every Day Smoker  . Smokeless tobacco: Never Used  . Alcohol use Yes     Comment: Daily   OB History    Gravida Para Term Preterm AB Living   4 2     2 2    SAB TAB Ectopic Multiple Live Births   2             Review of Systems  Constitutional: Positive for activity change. Negative for appetite change, chills, fatigue and fever.  HENT: Positive for congestion, postnasal drip, rhinorrhea, sinus pressure and sore throat. Negative for facial swelling.   Eyes: Positive for itching.  Respiratory: Positive for cough.   Cardiovascular: Negative.   Musculoskeletal: Negative for neck pain and neck stiffness.  Skin: Negative for pallor and rash.  Neurological: Negative.   All other systems reviewed and are negative.   Allergies   Prednisone  Home Medications   Prior to Admission medications   Medication Sig Start Date End Date Taking? Authorizing Provider  amoxicillin (AMOXIL) 875 MG tablet Take 1 tablet (875 mg total) by mouth 2 (two) times daily. 07/13/16  Yes [place], Velna HatchetKawanta F, MD  ferrous sulfate 325 (65 FE) MG tablet TAKE 1 TABLET BY MOUTH EVERY DAY 06/11/16  Yes , Velna HatchetKawanta F, MD  ipratropium (ATROVENT) 0.06 % nasal spray Place 2 sprays into both nostrils 4 (four) times daily. 08/23/16   Hayden RasmussenMabe, Evea Sheek, NP   Meds Ordered and Administered this Visit  Medications - No data to display  BP (!) 154/101 (BP Location: Right Arm)   Pulse (!) 104   Temp 98.7 F (37.1 C) (Oral)   Resp 17   SpO2 100%  No data found.   Physical Exam  Constitutional: She is oriented to person, place, and time. She appears well-developed and well-nourished. No distress.  HENT:  Bilateral TMs mildly retracted otherwise normal. Oropharynx with minor erythema and clear PND. No exudates or swelling.  Neck: Normal range of motion. Neck supple.  Cardiovascular: Normal rate, regular rhythm, normal heart sounds and intact distal pulses.   Pulmonary/Chest: Effort normal and breath sounds normal. No respiratory distress. She has no wheezes.  Musculoskeletal: Normal range of motion. She exhibits no edema.  Lymphadenopathy:    She has no  cervical adenopathy.  Neurological: She is alert and oriented to person, place, and time.  Skin: Skin is warm and dry. No rash noted.  Psychiatric: She has a normal mood and affect.  Nursing note and vitals reviewed.   Urgent Care Course     Procedures (including critical care time)  Labs Review Labs Reviewed - No data to display  Imaging Review No results found.   Visual Acuity Review  Right Eye Distance:   Left Eye Distance:   Bilateral Distance:    Right Eye Near:   Left Eye Near:    Bilateral Near:         MDM   1. Seasonal allergic rhinitis due to pollen   2. Allergic  sinusitis   3. PND (post-nasal drip)   4. Nasal congestion    Start using the Atrovent nasal spray as directed. Continue using your steroid nasal spray as directed also saline nasal spray frequently and even the Netty pot as needed. Sudafed PE 10 mg every 4 to 6 hours as needed for congestion Allegra or Zyrtec daily as needed for drainage and runny nose. For stronger antihistamine may take Chlor-Trimeton 2 to 4 mg every 4 to 6 hours, may cause drowsiness. Saline nasal spray used frequently. Drink plenty of fluids and stay well-hydrated.    Hayden Rasmussen, NP 08/23/16 2145

## 2016-10-12 DIAGNOSIS — H1032 Unspecified acute conjunctivitis, left eye: Secondary | ICD-10-CM | POA: Diagnosis not present

## 2016-10-14 ENCOUNTER — Encounter: Payer: Self-pay | Admitting: Family Medicine

## 2016-10-14 ENCOUNTER — Ambulatory Visit (INDEPENDENT_AMBULATORY_CARE_PROVIDER_SITE_OTHER): Payer: BLUE CROSS/BLUE SHIELD | Admitting: Family Medicine

## 2016-10-14 VITALS — BP 138/84 | HR 98 | Temp 98.6°F | Resp 14 | Ht 66.0 in | Wt 180.0 lb

## 2016-10-14 DIAGNOSIS — H539 Unspecified visual disturbance: Secondary | ICD-10-CM

## 2016-10-14 DIAGNOSIS — H40051 Ocular hypertension, right eye: Secondary | ICD-10-CM | POA: Diagnosis not present

## 2016-10-14 DIAGNOSIS — H209 Unspecified iridocyclitis: Secondary | ICD-10-CM | POA: Diagnosis not present

## 2016-10-14 DIAGNOSIS — H1032 Unspecified acute conjunctivitis, left eye: Secondary | ICD-10-CM | POA: Diagnosis not present

## 2016-10-14 NOTE — Progress Notes (Signed)
   Subjective:    Patient ID: Karina [name], female    DOB: Jan 23, 1976, 41 y.o.   MRN: [id number]  Patient presents for L Eye Pain (x6 days- states that she has pain, redness and swelling to eye- blood vessels in eye are pronounced- seen by OPTH on Monday and given Ofloxacin gtts)   Pt here with left eye pain and redness., started last week. She was out of town at a horse show, she does not remember getting anything in her eyes. [facility]  [facility]  given Oxfloxcin 0.3% drops . Told it was not an ulcer. Has some watering , Her eye pain has not improved, still has blurry vision, light sensitivity and eye is still red. She is not wearing her contacts.    Review Of Systems:  GEN- denies fatigue, fever, weight loss,weakness, recent illness HEENT- +eye drainage,+ change in vision, nasal discharge, CVS- denies chest pain, palpitations RESP- denies SOB, cough, wheeze ABD- denies N/V, change in stools, abd pain GU- denies dysuria, hematuria, dribbling, incontinence MSK- denies joint pain, muscle aches, injury Neuro- denies headache, dizziness, syncope, seizure activity       Objective:    BP 138/84   Pulse 98   Temp 98.6 F (37 C) (Oral)   Resp 14   Ht 5\' 6"  (1.676 m)   Wt 180 lb (81.6 kg)   SpO2 98%   BMI 29.05 kg/m  GEN- NAD, alert and oriented x3 HEENT- PERRL, EOMI, + injected  Left sclera, injected conjunctiva, MMM, oropharynx clear, vision grossly in tact, EOMI in tact, no swelling of lid, but TTP across Upper lid, no facial swelling, no sinus tenderness, TM clear bilat no effusion Neck- Supple, no LAD         Assessment & Plan:      Problem List Items Addressed This Visit    None    Visit Diagnoses    Acute conjunctivitis of left eye, unspecified acute conjunctivitis type    -  Primary   Send to another opthalmologist for 2nd opiion, ? more irritant/chemical conjunctivitis since minimal response to bacterial drops. Concern with ongoing  vision change    Relevant Orders   Ambulatory referral to Ophthalmology   Change in vision       Relevant Orders   Ambulatory referral to Ophthalmology      Note: This dictation was prepared with Dragon dictation along with smaller phrase technology. Any transcriptional errors that result from this process are unintentional.

## 2016-10-23 DIAGNOSIS — H209 Unspecified iridocyclitis: Secondary | ICD-10-CM | POA: Diagnosis not present

## 2016-10-23 DIAGNOSIS — H40051 Ocular hypertension, right eye: Secondary | ICD-10-CM | POA: Diagnosis not present

## 2016-12-16 ENCOUNTER — Other Ambulatory Visit: Payer: Self-pay | Admitting: Family Medicine

## 2016-12-17 DIAGNOSIS — H15002 Unspecified scleritis, left eye: Secondary | ICD-10-CM | POA: Diagnosis not present

## 2016-12-17 DIAGNOSIS — H40051 Ocular hypertension, right eye: Secondary | ICD-10-CM | POA: Diagnosis not present

## 2016-12-17 DIAGNOSIS — H209 Unspecified iridocyclitis: Secondary | ICD-10-CM | POA: Diagnosis not present

## 2016-12-17 DIAGNOSIS — H40052 Ocular hypertension, left eye: Secondary | ICD-10-CM | POA: Diagnosis not present

## 2016-12-22 ENCOUNTER — Encounter: Payer: Self-pay | Admitting: Family Medicine

## 2016-12-22 ENCOUNTER — Ambulatory Visit (INDEPENDENT_AMBULATORY_CARE_PROVIDER_SITE_OTHER): Payer: BLUE CROSS/BLUE SHIELD | Admitting: Family Medicine

## 2016-12-22 VITALS — BP 132/70 | HR 82 | Temp 98.3°F | Resp 14 | Ht 66.0 in | Wt 183.0 lb

## 2016-12-22 DIAGNOSIS — H15002 Unspecified scleritis, left eye: Secondary | ICD-10-CM

## 2016-12-22 DIAGNOSIS — K580 Irritable bowel syndrome with diarrhea: Secondary | ICD-10-CM | POA: Diagnosis not present

## 2016-12-22 DIAGNOSIS — R5383 Other fatigue: Secondary | ICD-10-CM | POA: Diagnosis not present

## 2016-12-22 DIAGNOSIS — F411 Generalized anxiety disorder: Secondary | ICD-10-CM

## 2016-12-22 DIAGNOSIS — K589 Irritable bowel syndrome without diarrhea: Secondary | ICD-10-CM | POA: Insufficient documentation

## 2016-12-22 DIAGNOSIS — K219 Gastro-esophageal reflux disease without esophagitis: Secondary | ICD-10-CM | POA: Diagnosis not present

## 2016-12-22 DIAGNOSIS — Z13220 Encounter for screening for lipoid disorders: Secondary | ICD-10-CM

## 2016-12-22 DIAGNOSIS — F331 Major depressive disorder, recurrent, moderate: Secondary | ICD-10-CM | POA: Diagnosis not present

## 2016-12-22 DIAGNOSIS — R1084 Generalized abdominal pain: Secondary | ICD-10-CM | POA: Diagnosis not present

## 2016-12-22 LAB — URINALYSIS, ROUTINE W REFLEX MICROSCOPIC
Bacteria, UA: NONE SEEN /HPF
Bilirubin Urine: NEGATIVE
Glucose, UA: NEGATIVE
Ketones, ur: NEGATIVE
Leukocytes, UA: NEGATIVE
Nitrite: NEGATIVE
Specific Gravity, Urine: 1.022 (ref 1.001–1.03)
WBC, UA: NONE SEEN /HPF (ref 0–5)
pH: 8.5 — ABNORMAL HIGH (ref 5.0–8.0)

## 2016-12-22 LAB — MICROSCOPIC MESSAGE

## 2016-12-22 MED ORDER — DEXLANSOPRAZOLE 60 MG PO CPDR: 60.0000 mg | DELAYED_RELEASE_CAPSULE | Freq: Every day | ORAL | 0 refills | Status: DC

## 2016-12-22 MED ORDER — ALIGN 4 MG PO CAPS: ORAL_CAPSULE | ORAL | 3 refills | Status: AC

## 2016-12-22 MED ORDER — ESCITALOPRAM OXALATE 5 MG PO TABS: 5.0000 mg | ORAL_TABLET | Freq: Every day | ORAL | 3 refills | Status: DC

## 2016-12-22 NOTE — Patient Instructions (Addendum)
[facility]  - 1 CAPSULE DAILY  We will call with lab results Get the chest xray done  [address]  Start lexapro  once a day  Call [place] [phone] or  Call [facility]- [phone] Take dexilant once a day in the morning for your stomach  F/U 6 weeks
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
## 2016-12-23 LAB — LIPID PANEL
Cholesterol: 215 mg/dL — ABNORMAL HIGH (ref <200)
HDL: 50 mg/dL — ABNORMAL LOW (ref >50)
LDL Cholesterol (Calc): 139 mg/dL (calc) — ABNORMAL HIGH
Non-HDL Cholesterol (Calc): 165 mg/dL (calc) — ABNORMAL HIGH (ref <130)
Total CHOL/HDL Ratio: 4.3 (calc) (ref <5.0)
Triglycerides: 132 mg/dL (ref <150)

## 2016-12-23 LAB — CBC WITH DIFFERENTIAL/PLATELET
Basophils Absolute: 48 cells/uL (ref 0–200)
Basophils Relative: 0.6 %
Eosinophils Absolute: 56 cells/uL (ref 15–500)
Eosinophils Relative: 0.7 %
HCT: 42.6 % (ref 35.0–45.0)
Hemoglobin: 14.5 g/dL (ref 11.7–15.5)
Lymphs Abs: 1776 cells/uL (ref 850–3900)
MCH: 32.7 pg (ref 27.0–33.0)
MCHC: 34 g/dL (ref 32.0–36.0)
MCV: 95.9 fL (ref 80.0–100.0)
MPV: 9.4 fL (ref 7.5–12.5)
Monocytes Relative: 7.2 %
Neutro Abs: 5544 cells/uL (ref 1500–7800)
Neutrophils Relative %: 69.3 %
Platelets: 288 10*3/uL (ref 140–400)
RBC: 4.44 10*6/uL (ref 3.80–5.10)
RDW: 11.9 % (ref 11.0–15.0)
Total Lymphocyte: 22.2 %
WBC mixed population: 576 cells/uL (ref 200–950)
WBC: 8 10*3/uL (ref 3.8–10.8)

## 2016-12-23 LAB — COMPREHENSIVE METABOLIC PANEL
AG Ratio: 1.9 (calc) (ref 1.0–2.5)
ALT: 12 U/L (ref 6–29)
AST: 16 U/L (ref 10–30)
Albumin: 4.5 g/dL (ref 3.6–5.1)
Alkaline phosphatase (APISO): 73 U/L (ref 33–115)
BUN: 17 mg/dL (ref 7–25)
CO2: 26 mmol/L (ref 20–32)
Calcium: 9.5 mg/dL (ref 8.6–10.2)
Chloride: 104 mmol/L (ref 98–110)
Creat: 0.72 mg/dL (ref 0.50–1.10)
Globulin: 2.4 g/dL (calc) (ref 1.9–3.7)
Glucose, Bld: 91 mg/dL (ref 65–99)
Potassium: 4.4 mmol/L (ref 3.5–5.3)
Sodium: 139 mmol/L (ref 135–146)
Total Bilirubin: 0.5 mg/dL (ref 0.2–1.2)
Total Protein: 6.9 g/dL (ref 6.1–8.1)

## 2016-12-23 LAB — B. BURGDORFI ANTIBODIES: B burgdorferi Ab IgG+IgM: <0.9 index

## 2016-12-23 LAB — TSH: TSH: 1.24 mIU/L

## 2016-12-23 LAB — RPR: RPR Ser Ql: NONREACTIVE

## 2016-12-23 LAB — T4, FREE: Free T4: 1.1 ng/dL (ref 0.8–1.8)

## 2016-12-23 LAB — T3, FREE: T3, Free: 3 pg/mL (ref 2.3–4.2)

## 2016-12-23 LAB — LIPASE: Lipase: 28 U/L (ref 7–60)

## 2016-12-24 LAB — QUANTIFERON TB GOLD ASSAY (BLOOD)
Mitogen-Nil: >10 IU/mL
QUANTIFERON(R)-TB GOLD: NEGATIVE
Quantiferon Nil Value: 0.03 IU/mL
Quantiferon Tb Ag Minus Nil Value: <0 IU/mL

## 2016-12-24 LAB — ANCA SCREEN W REFLEX TITER: ANCA Screen: NEGATIVE

## 2016-12-28 ENCOUNTER — Other Ambulatory Visit: Payer: Self-pay | Admitting: *Deleted

## 2016-12-28 DIAGNOSIS — R319 Hematuria, unspecified: Secondary | ICD-10-CM

## 2016-12-28 LAB — HLA-B27 ANTIGEN: HLA-B27 Antigen: POSITIVE — AB

## 2016-12-28 LAB — ANGIOTENSIN CONVERTING ENZYME: Angiotensin-Converting Enzyme: 34 U/L (ref 9–67)

## 2016-12-28 LAB — FLUORESCENT TREPONEMAL AB(FTA)-IGG-BLD: Fluorescent Treponemal ABS: NONREACTIVE

## 2016-12-28 LAB — HIV ANTIBODY (ROUTINE TESTING W REFLEX): HIV 1&2 Ab, 4th Generation: NONREACTIVE

## 2016-12-28 LAB — ANA: Anti Nuclear Antibody(ANA): NEGATIVE

## 2016-12-28 LAB — RHEUMATOID FACTOR: Rhuematoid fact SerPl-aCnc: <14 IU/mL (ref <14)

## 2017-02-02 ENCOUNTER — Ambulatory Visit: Payer: BLUE CROSS/BLUE SHIELD | Admitting: Family Medicine

## 2017-02-02 ENCOUNTER — Encounter: Payer: Self-pay | Admitting: Family Medicine

## 2017-04-11 ENCOUNTER — Other Ambulatory Visit: Payer: Self-pay | Admitting: Family Medicine

## 2017-04-12 DIAGNOSIS — H209 Unspecified iridocyclitis: Secondary | ICD-10-CM | POA: Diagnosis not present

## 2017-05-24 ENCOUNTER — Encounter: Payer: Self-pay | Admitting: Family Medicine

## 2017-05-24 DIAGNOSIS — H209 Unspecified iridocyclitis: Secondary | ICD-10-CM | POA: Diagnosis not present

## 2017-05-24 DIAGNOSIS — D508 Other iron deficiency anemias: Secondary | ICD-10-CM | POA: Diagnosis not present

## 2017-05-24 DIAGNOSIS — R5383 Other fatigue: Secondary | ICD-10-CM | POA: Diagnosis not present

## 2017-05-24 DIAGNOSIS — Z1589 Genetic susceptibility to other disease: Secondary | ICD-10-CM | POA: Diagnosis not present

## 2017-06-11 NOTE — Telephone Encounter (Signed)
Spoke with patient and patient has concerns of her hormones. She states that she was unsure if she was in perimenopause or not because she has sweats, and has started having more hair growth and just feels that her hormones are off. She made mention of this to her rheumatologist and they stated she needed to see endocrinology but to contact her PCP for that referral. Please advise?

## 2017-07-07 ENCOUNTER — Other Ambulatory Visit: Payer: Self-pay | Admitting: Family Medicine

## 2017-08-17 ENCOUNTER — Other Ambulatory Visit: Payer: Self-pay

## 2017-08-17 ENCOUNTER — Encounter: Payer: Self-pay | Admitting: Family Medicine

## 2017-08-17 ENCOUNTER — Ambulatory Visit: Payer: BLUE CROSS/BLUE SHIELD | Admitting: Family Medicine

## 2017-08-17 VITALS — BP 128/80 | HR 70 | Temp 98.9°F | Resp 18 | Ht 66.0 in | Wt 183.0 lb

## 2017-08-17 DIAGNOSIS — J0100 Acute maxillary sinusitis, unspecified: Secondary | ICD-10-CM | POA: Diagnosis not present

## 2017-08-17 MED ORDER — AMOXICILLIN 875 MG PO TABS: 875.0000 mg | ORAL_TABLET | Freq: Two times a day (BID) | ORAL | 0 refills | Status: DC

## 2017-08-17 NOTE — Progress Notes (Signed)
    Subjective:    Patient ID: Karina [name], female    DOB: 08-May-1975, 42 y.o.   MRN: [id number]  Patient presents for Illness (x1 month- scratchy throat, nasal congestion/ drainage with clear fuids, sinus pressure, ear pressure, feer/ chills)  Sinus congestion , had graduation, no fever, ear pressure, chills   Taking elderberry and herbal    Has not tried anti-histamine   Has some couhing Sore throat for 3 weeks , no sick contacts Using qnasal      Review Of Systems: per above   GEN- denies fatigue, fever, weight loss,weakness, recent illness HEENT- denies eye drainage, change in vision,+nasal discharge, CVS- denies chest pain, palpitations RESP- denies SOB,+ cough, wheeze ABD- denies N/V, change in stools, abd pain GU- denies dysuria, hematuria, dribbling, incontinence MSK- denies joint pain, muscle aches, injury Neuro- denies headache, dizziness, syncope, seizure activity       Objective:    BP 128/80   Pulse 70   Temp 98.9 F (37.2 C) (Oral)   Resp 18   Ht 5\' 6"  (1.676 m)   Wt 183 lb (83 kg)   SpO2 96%   BMI 29.54 kg/m  GEN- NAD, alert and oriented x3 HEENT- PERRL, EOMI, non injected sclera, pink conjunctiva, MMM, oropharynx post nasal drip, TM clear bilat no effusion,  + maxillary/ethomoid sinus tenderness, inflammed turbinates,  Nasal drainage  Neck- Supple, no LAD CVS- RRR, no murmur RESP-CTAB EXT- No edema Pulses- Radial 2+          Assessment & Plan:      Problem List Items Addressed This Visit    None    Visit Diagnoses    Acute maxillary sinusitis, recurrence not specified    -  Primary   treat amoxicillin, continue nasal spray, add oral anti-histamine   Relevant Medications   amoxicillin (AMOXIL) 875 MG tablet      Note: This dictation was prepared with Dragon dictation along with smaller phrase technology. Any transcriptional errors that result from this process are unintentional.

## 2017-08-17 NOTE — Patient Instructions (Addendum)
F/U as needed  Take claritin or zyrtec  Continue nasal spray

## 2017-08-18 ENCOUNTER — Telehealth: Payer: Self-pay | Admitting: *Deleted

## 2017-08-18 MED ORDER — FLUTICASONE PROPIONATE 50 MCG/ACT NA SUSP: 2.0000 | Freq: Every day | NASAL | 6 refills | Status: DC

## 2017-08-18 NOTE — Telephone Encounter (Signed)
Received fax requesting alternative to QNasal. Medication has been discontinued.   MD made aware and NO given for Flonase. Prescription sent to pharmacy.

## 2017-12-27 ENCOUNTER — Ambulatory Visit: Payer: BLUE CROSS/BLUE SHIELD | Admitting: Family Medicine

## 2017-12-27 ENCOUNTER — Encounter: Payer: Self-pay | Admitting: Family Medicine

## 2017-12-27 ENCOUNTER — Ambulatory Visit (INDEPENDENT_AMBULATORY_CARE_PROVIDER_SITE_OTHER): Payer: BLUE CROSS/BLUE SHIELD | Admitting: Family Medicine

## 2017-12-27 VITALS — BP 170/110 | HR 86 | Temp 98.1°F | Resp 18 | Ht 66.0 in | Wt 186.0 lb

## 2017-12-27 DIAGNOSIS — J329 Chronic sinusitis, unspecified: Secondary | ICD-10-CM

## 2017-12-27 MED ORDER — FLUTICASONE PROPIONATE 50 MCG/ACT NA SUSP: 2.0000 | Freq: Every day | NASAL | 6 refills | Status: DC

## 2017-12-27 MED ORDER — LEVOCETIRIZINE DIHYDROCHLORIDE 5 MG PO TABS: 5.0000 mg | ORAL_TABLET | Freq: Every evening | ORAL | 0 refills | Status: DC

## 2017-12-27 NOTE — Progress Notes (Signed)
Subjective:    Patient ID: Karina [name], female    DOB: [birth year], 42 y.o.   MRN: [id number]  HPI  Patient is a 42 year old white female who has been dealing with sinus issues off and on since June.  She was seen here June 11 and was diagnosed with maxillary sinusitis and treated.  This was around roughly the same time that she got a new pet.  Over the last several months, she reports persistent pain and pressure in both maxillary sinuses.  She reports swelling inside her nose making it difficult to breathe through her nostrils.  She reports rhinorrhea and head congestion and postnasal drip.  She also reports fatigue and she is requesting a referral to an endocrinologist to have her hormones checked.  She also reports 3 separate instances where she would awaken from sleep vomiting with occasional diarrhea.  This is occurred every month.  The first 2 incidences occurred after she drank alcohol.  The second instance occurred recently and was unprovoked.  She believes that she has cyclic vomiting syndrome.  She believes this because episodes occur once a month.  They occur at the exact same time at night.  They last several hours.  They are associated with sweating abdominal discomfort and vomiting.  Past Medical History:  Diagnosis Date  . Abnormal Pap smear   . Anemia   . Anxiety   . GERD (gastroesophageal reflux disease)   . Headache(784.0)   . Ovarian cyst   . Palpitations   . Sinus infection   . Vitamin D deficiency    Past Surgical History:  Procedure Laterality Date  . CERVICAL CONE BIOPSY    . ECTOPIC PREGNANCY SURGERY     ruptured  . TONSILLECTOMY    . US ECHOCARDIOGRAPHY  08-30-2007   EF 60-65%   Current Outpatient Medications on File Prior to Visit  Medication Sig Dispense Refill  . clindamycin (CLEOCIN) 150 MG capsule   0  . ferrous sulfate 325 (65 FE) MG tablet TAKE 1 TABLET BY MOUTH EVERY DAY 100 tablet 1  . ibuprofen (ADVIL,MOTRIN) 800 MG tablet   0  . Probiotic  Product (ALIGN) 4 MG CAPS 1 CAPSLE DAILY 30 capsule 3   No current facility-administered medications on file prior to visit.    Allergies  Allergen Reactions  . Prednisone     "jitters"   Social History   Socioeconomic History  . Marital status: Married    Spouse name: Not on file  . Number of children: Not on file  . Years of education: Not on file  . Highest education level: Not on file  Occupational History  . Not on file  Social Needs  . Financial resource strain: Not on file  . Food insecurity:    Worry: Not on file    Inability: Not on file  . Transportation needs:    Medical: Not on file    Non-medical: Not on file  Tobacco Use  . Smoking status: Current Every Day Smoker  . Smokeless tobacco: Never Used  Substance and Sexual Activity  . Alcohol use: Yes    Comment: Daily  . Drug use: Not on file  . Sexual activity: Yes    Birth control/protection: None  Lifestyle  . Physical activity:    Days per week: Not on file    Minutes per session: Not on file  . Stress: Not on file  Relationships  . Social connections:    Talks on phone: Not on  file    Gets together: Not on file    Attends religious service: Not on file    Active member of club or organization: Not on file    Attends meetings of clubs or organizations: Not on file    Relationship status: Not on file  . Intimate partner violence:    Fear of current or ex partner: Not on file    Emotionally abused: Not on file    Physically abused: Not on file    Forced sexual activity: Not on file  Other Topics Concern  . Not on file  Social History Narrative  . Not on file     Review of Systems  All other systems reviewed and are negative.      Objective:   Physical Exam  Constitutional: She appears well-developed and well-nourished. No distress.  HENT:  Head: Normocephalic and atraumatic.  Right Ear: Tympanic membrane and ear canal normal.  Left Ear: Tympanic membrane and ear canal normal.  Nose:  Mucosal edema and rhinorrhea present. Right sinus exhibits maxillary sinus tenderness. Left sinus exhibits maxillary sinus tenderness.  Eyes: Conjunctivae are normal.  Neck: Neck supple.  Cardiovascular: Normal rate, regular rhythm and normal heart sounds. Exam reveals no gallop and no friction rub.  No murmur heard. Pulmonary/Chest: Effort normal and breath sounds normal. No stridor. No respiratory distress. She has no wheezes. She has no rales. She exhibits no tenderness.  Lymphadenopathy:    She has no cervical adenopathy.  Skin: She is not diaphoretic.  Vitals reviewed.         Assessment & Plan:  Chronic sinusitis, unspecified location  I believe the patient is likely dealing with chronic sinusitis I suspect secondary to allergies.  I recommended that we maximize treatment for allergies including Flonase 2 sprays each nostril daily coupled with Xyzal 5 mg daily.  For the first 3 days, I also want her to use Afrin due to the significant swelling in her nasal passages to try to reduce the swelling and allow the Flonase to penetrate deeper into the nasal cavity.  Patient is already on clindamycin 300 mg 4 times daily for a "dental infection".  Therefore I hesitate to add any antibiotics at the present time.  Furthermore she is afebrile with no significant sinus pain other than pressure.  She is allergic to prednisone.  Therefore wait to see the response on allergy medication.  Regarding her blood pressure today, it is never this high.  Therefore I recommended that we check this again in the future to see if this is persistent.  Previous blood pressures have been well controlled.  Regarding her cyclic vomiting syndrome, I recommended that we treat the underlying chronic sinusitis she has been dealing with and see if the symptoms improve.  If it does not, consider adding acid reflux medication to see if this will be beneficial.  Regarding her fatigue, I have recommended checking a cortisol level,  TSH.  She has an appointment to see her gynecologist later this week to check her [facility] and [facility] due to her lack of menstrual cycles over the last 4 months which may be contributing to her fatigue.  I question if the patient could be pregnant she states that her husband has had a vasectomy and therefore she does not believe so.  Therefore she defers to allow her gynecologist to check her hormone levels.

## 2017-12-29 DIAGNOSIS — E559 Vitamin D deficiency, unspecified: Secondary | ICD-10-CM | POA: Diagnosis not present

## 2017-12-29 DIAGNOSIS — Z13220 Encounter for screening for lipoid disorders: Secondary | ICD-10-CM | POA: Diagnosis not present

## 2017-12-29 DIAGNOSIS — Z6830 Body mass index (BMI) 30.0-30.9, adult: Secondary | ICD-10-CM | POA: Diagnosis not present

## 2017-12-29 DIAGNOSIS — Z01419 Encounter for gynecological examination (general) (routine) without abnormal findings: Secondary | ICD-10-CM | POA: Diagnosis not present

## 2017-12-29 DIAGNOSIS — Z1231 Encounter for screening mammogram for malignant neoplasm of breast: Secondary | ICD-10-CM | POA: Diagnosis not present

## 2017-12-29 DIAGNOSIS — N959 Unspecified menopausal and perimenopausal disorder: Secondary | ICD-10-CM | POA: Diagnosis not present

## 2017-12-29 DIAGNOSIS — Z124 Encounter for screening for malignant neoplasm of cervix: Secondary | ICD-10-CM | POA: Diagnosis not present

## 2017-12-29 DIAGNOSIS — Z13228 Encounter for screening for other metabolic disorders: Secondary | ICD-10-CM | POA: Diagnosis not present

## 2017-12-29 DIAGNOSIS — Z862 Personal history of diseases of the blood and blood-forming organs and certain disorders involving the immune mechanism: Secondary | ICD-10-CM | POA: Diagnosis not present

## 2018-01-06 ENCOUNTER — Emergency Department (HOSPITAL_COMMUNITY): Payer: BLUE CROSS/BLUE SHIELD

## 2018-01-06 ENCOUNTER — Encounter (HOSPITAL_COMMUNITY): Payer: Self-pay | Admitting: Emergency Medicine

## 2018-01-06 ENCOUNTER — Emergency Department (HOSPITAL_COMMUNITY)
Admission: EM | Admit: 2018-01-06 | Discharge: 2018-01-07 | Disposition: A | Discharge status: Home / Self Care | Payer: BLUE CROSS/BLUE SHIELD | Attending: Emergency Medicine | Admitting: Emergency Medicine

## 2018-01-06 DIAGNOSIS — Z79899 Other long term (current) drug therapy: Secondary | ICD-10-CM | POA: Diagnosis not present

## 2018-01-06 DIAGNOSIS — Z23 Encounter for immunization: Secondary | ICD-10-CM | POA: Insufficient documentation

## 2018-01-06 DIAGNOSIS — S56429A Laceration of extensor muscle, fascia and tendon of unspecified finger at forearm level, initial encounter: Secondary | ICD-10-CM

## 2018-01-06 DIAGNOSIS — S66320A Laceration of extensor muscle, fascia and tendon of right index finger at wrist and hand level, initial encounter: Secondary | ICD-10-CM | POA: Diagnosis not present

## 2018-01-06 DIAGNOSIS — F17210 Nicotine dependence, cigarettes, uncomplicated: Secondary | ICD-10-CM | POA: Diagnosis not present

## 2018-01-06 DIAGNOSIS — Y939 Activity, unspecified: Secondary | ICD-10-CM | POA: Diagnosis not present

## 2018-01-06 DIAGNOSIS — Y929 Unspecified place or not applicable: Secondary | ICD-10-CM | POA: Insufficient documentation

## 2018-01-06 DIAGNOSIS — S61209A Unspecified open wound of unspecified finger without damage to nail, initial encounter: Secondary | ICD-10-CM

## 2018-01-06 DIAGNOSIS — W260XXA Contact with knife, initial encounter: Secondary | ICD-10-CM | POA: Diagnosis not present

## 2018-01-06 DIAGNOSIS — Y999 Unspecified external cause status: Secondary | ICD-10-CM | POA: Diagnosis not present

## 2018-01-06 DIAGNOSIS — S61214A Laceration without foreign body of right ring finger without damage to nail, initial encounter: Secondary | ICD-10-CM | POA: Diagnosis not present

## 2018-01-06 MED ORDER — LIDOCAINE HCL (PF) 1 % IJ SOLN
5.0000 mL | Freq: Once | INTRAMUSCULAR | Status: AC
  Administered 2018-01-07: 5 mL
  Filled 2018-01-06: qty 5

## 2018-01-06 MED ORDER — TETANUS-DIPHTH-ACELL PERTUSSIS 5-2.5-18.5 LF-MCG/0.5 IM SUSP
0.5000 mL | Freq: Once | INTRAMUSCULAR | Status: AC
  Administered 2018-01-06: 0.5 mL via INTRAMUSCULAR
  Filled 2018-01-06: qty 0.5
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
## 2018-01-06 NOTE — ED Triage Notes (Signed)
Pt reports laceration to R index finger prior to arrival. Bleeding controlled, cms intact. ETOH on board. Took 2 advils prior to arrival.

## 2018-01-07 MED ORDER — IBUPROFEN 400 MG PO TABS: 600.0000 mg | ORAL_TABLET | Freq: Once | ORAL | Status: DC

## 2018-01-07 MED ORDER — CEPHALEXIN 500 MG PO CAPS: 500.0000 mg | ORAL_CAPSULE | Freq: Four times a day (QID) | ORAL | 0 refills | Status: DC

## 2018-01-07 NOTE — ED Provider Notes (Signed)
[facility] EMERGENCY DEPARTMENT Provider Note   CSN: [id number] Arrival date & time: 01/06/18  2038     History   Chief Complaint Chief Complaint  Patient presents with  . Extremity Laceration    HPI Karina [name] is a 42 y.o. female.  Patient presents the emergency department with complaints of finger laceration.  Patient was reportedly cleaning a brand-new knife when she cut the dorsal aspect of her right index finger.  Finger immediately started bleeding and patient applied pressure.  No other treatments prior to arrival.  She admits to drinking alcohol.  Tetanus unknown.  She denies numbness or tingling of the finger.  She is able to bend the finger.     Past Medical History:  Diagnosis Date  . Abnormal Pap smear   . Anemia   . Anxiety   . GERD (gastroesophageal reflux disease)   . Headache(784.0)   . Ovarian cyst   . Palpitations   . Sinus infection   . Vitamin D deficiency     Patient Active Problem List   Diagnosis Date Noted  . IBS (irritable bowel syndrome) 12/22/2016  . GAD (generalized anxiety disorder) 08/27/2015  . OCD (obsessive compulsive disorder) 08/27/2015  . Depression 08/27/2015  . GERD (gastroesophageal reflux disease) 02/09/2014  . Tobacco use disorder 02/09/2014  . Ovarian cyst   . Abdominal pain 09/16/2011  . 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 History of conization of cervix 09/16/2011  . Palpitations 06/15/2011    Past Surgical History:  Procedure Laterality Date  . CERVICAL CONE BIOPSY    . ECTOPIC PREGNANCY SURGERY     ruptured  . TONSILLECTOMY    . US ECHOCARDIOGRAPHY  08-30-2007   EF 60-65%     OB History    Gravida  4   Para  2   Term      Preterm      AB  2   Living  2     SAB  2   TAB      Ectopic      Multiple      Live Births               Home Medications    Prior to Admission medications   Medication Sig  Start Date End Date Taking? Authorizing Provider  cephALEXin (KEFLEX) 500 MG capsule Take 1 capsule (500 mg total) by mouth 4 (four) times daily. 01/07/18   Renne Crigler, PA-C  clindamycin (CLEOCIN) 150 MG capsule  12/23/17   [provider]  ferrous sulfate 325 (65 FE) MG tablet TAKE 1 TABLET BY MOUTH EVERY DAY 07/07/17   Van Vleck, Velna Hatchet, MD  fluticasone [facility]) 50 MCG/ACT nasal spray Place 2 sprays into both nostrils daily. 12/27/17   Donita Brooks, MD  ibuprofen (ADVIL,MOTRIN) 800 MG tablet  12/23/17   [provider]  levocetirizine (XYZAL) 5 MG tablet Take 1 tablet (5 mg total) by mouth every evening. 12/27/17   Donita Brooks, MD  Probiotic Product (ALIGN) 4 MG CAPS 1 CAPSLE DAILY 12/22/16   Salley Scarlet, MD    Family History History reviewed. No pertinent family history.  Social History Social History   Tobacco Use  . Smoking status: Current Every Day Smoker  . Smokeless tobacco: Never Used  Substance Use Topics  . Alcohol use: Yes    Comment: Daily  . Drug use: Not on file  Allergies   Prednisone   Review of Systems Review of Systems  Constitutional: Negative for activity change.  Musculoskeletal: Positive for myalgias. Negative for arthralgias, back pain, joint swelling and neck pain.  Skin: Positive for wound.  Neurological: Negative for weakness and numbness.     Physical Exam Updated Vital Signs BP (!) 173/125 (BP Location: Left Arm)   Pulse (!) 119   Temp 98.8 F (37.1 C) (Oral)   Resp 16   Ht 5\' 6"  (1.676 m)   Wt 85 kg   SpO2 96%   BMI 30.25 kg/m   Physical Exam  Constitutional: She appears well-developed and well-nourished.  HENT:  Head: Normocephalic and atraumatic.  Eyes: Pupils are equal, round, and reactive to light.  Neck: Normal range of motion. Neck supple.  Cardiovascular: Normal pulses. Exam reveals no decreased pulses.  Musculoskeletal: She exhibits tenderness. She exhibits no edema.    Neurological: She is alert. No sensory deficit.  Motor, sensation, and vascular distal to the injury is fully intact.   Skin: Skin is warm and dry.  2 cm minimally gaping laceration to the dorsal aspect of left index finger between the MCP and PIP joints.  Wound base explored.  Patient has a partial extensor tendon laceration.  She has preserved extension and finger.  No foreign bodies visualized.  Mild active bleeding.  Psychiatric: She has a normal mood and affect.  Nursing note and vitals reviewed.    ED Treatments / Results  Labs (all labs ordered are listed, but only abnormal results are displayed) Labs Reviewed - No data to display  EKG None  Radiology Dg Finger Index Right  Result Date: 01/06/2018 CLINICAL DATA:  Laceration to R index finger X tonight EXAM: RIGHT INDEX FINGER 2+V COMPARISON:  None. FINDINGS: There is no evidence of fracture or dislocation. There is no evidence of arthropathy or other focal bone abnormality. No radiodense foreign body. No subcutaneous gas. Soft tissues are unremarkable. IMPRESSION: Negative. Electronically Signed   By: Corlis Leak M.D.   On: 01/06/2018 21:26    Procedures .Marland KitchenLaceration Repair Date/Time: 01/07/2018 12:58 AM Performed by: Renne Crigler, PA-C Authorized by: Renne Crigler, PA-C   Consent:    Consent obtained:  Verbal   Consent given by:  Patient   Risks discussed:  Infection, pain, tendon damage and nerve damage   Alternatives discussed:  Referral Anesthesia (see MAR for exact dosages):    Anesthesia method:  Local infiltration   Local anesthetic:  Lidocaine 1% w/o epi Laceration details:    Location:  Finger   Finger location:  R index finger   Length (cm):  2 Repair type:    Repair type:  Simple Pre-procedure details:    Preparation:  Patient was prepped and draped in usual sterile fashion and imaging obtained to evaluate for foreign bodies Exploration:    Hemostasis achieved with:  Direct pressure   Wound  exploration: wound explored through full range of motion and entire depth of wound probed and visualized     Wound extent: tendon damage     Tendon damage location:  Upper extremity   Upper extremity tendon damage location:  Finger extensor   Tendon damage extent:  Partial transection   Tendon repair plan:  Refer for evaluation   Contaminated: no   Treatment:    Area cleansed with:  Shur-Clens   Amount of cleaning:  Standard Skin repair:    Repair method:  Sutures   Suture size:  5-0   Suture material:  Nylon   Suture technique:  Simple interrupted   Number of sutures:  4 Approximation:    Approximation:  Close Post-procedure details:    Dressing:  Splint for protection   Patient tolerance of procedure:  Tolerated well, no immediate complications   (including critical care time)  Medications Ordered in ED Medications  Tdap (BOOSTRIX) injection 0.5 mL (0.5 mLs Intramuscular Given 01/06/18 2337)  lidocaine (PF) (XYLOCAINE) 1 % injection 5 mL (5 mLs Infiltration Given by Other 01/07/18 0024)     Initial Impression / Assessment and Plan / ED Course  I have reviewed the triage vital signs and the nursing notes.  Pertinent labs & imaging results that were available during my care of the patient were reviewed by me and considered in my medical decision making (see chart for details).     Patient seen and examined.    Vital signs reviewed and are as follows: BP (!) 130/95   Pulse 89   Temp 98.8 F (37.1 C) (Oral)   Resp 18   Ht 5\' 6"  (1.676 m)   Wt 85 kg   SpO2 98%   BMI 30.25 kg/m   Wound evaluated and repaired as above.  Patient has extensor tendon laceration with preserved function.  She will be placed in a finger splint, started on Keflex, and given orthopedic hand follow-up.  Patient counseled on wound care. Patient counseled on need to return or see PCP/urgent care for suture removal in 10 days. Patient was urged to return to the Emergency Department urgently with  worsening pain, swelling, expanding erythema especially if it streaks away from the affected area, fever, or if they have any other concerns. Patient verbalized understanding.    Final Clinical Impressions(s) / ED Diagnoses   Final diagnoses:  Extensor tendon laceration of finger with open wound, initial encounter   Patient with finger laceration complicated by partial disruption of the extensor tendon on the right index finger.  Wound cleaned and repaired as above.  Finger is neurovascularly intact.    ED Discharge Orders         Ordered    cephALEXin (KEFLEX) 500 MG capsule  4 times daily     01/07/18 0032           Renne Crigler, PA-C 01/07/18 0104    Geoffery Lyons, MD 01/07/18 [phone]

## 2018-01-07 NOTE — Discharge Instructions (Signed)
Please read and follow all provided instructions.  Your diagnoses today include:  1. Extensor tendon laceration of finger with open wound, initial encounter     Tests performed today include:  X-ray of the affected area that did not show any foreign bodies or broken bones  Vital signs. See below for your results today.   Medications prescribed:   None  Take any prescribed medications only as directed.   Home care instructions:  Follow any educational materials and wound care instructions contained in this packet.   Keep affected area above the level of your heart when possible to minimize swelling. Wash area gently twice a day with warm soapy water. Do not apply alcohol or hydrogen peroxide. Cover the area if it draining or weeping.   Follow-up instructions: Suture Removal: Return to the Emergency Department or see your primary care care doctor in 10 days for a recheck of your wound and removal of your sutures or staples.    Please follow-up with orthopedic hand doctor referral as you have a tendon laceration which will need to be addressed.  Return instructions:  Return to the Emergency Department if you have:  Fever  Worsening pain  Worsening swelling of the wound  Pus draining from the wound  Redness of the skin that moves away from the wound, especially if it streaks away from the affected area   Any other emergent concerns  Your vital signs today were: BP (!) 173/125 (BP Location: Left Arm)    Pulse (!) 119    Temp 98.8 F (37.1 C) (Oral)    Resp 16    Ht 5\' 6"  (1.676 m)    Wt 85 kg    SpO2 96%    BMI 30.25 kg/m  If your blood pressure (BP) was elevated above 135/85 this visit, please have this repeated by your doctor within one month. --------------

## 2018-01-12 DIAGNOSIS — S66320A Laceration of extensor muscle, fascia and tendon of right index finger at wrist and hand level, initial encounter: Secondary | ICD-10-CM | POA: Diagnosis not present

## 2018-01-12 DIAGNOSIS — S61210A Laceration without foreign body of right index finger without damage to nail, initial encounter: Secondary | ICD-10-CM | POA: Diagnosis not present

## 2018-01-13 DIAGNOSIS — I10 Essential (primary) hypertension: Secondary | ICD-10-CM | POA: Diagnosis not present

## 2018-01-13 DIAGNOSIS — N959 Unspecified menopausal and perimenopausal disorder: Secondary | ICD-10-CM | POA: Diagnosis not present

## 2018-01-13 DIAGNOSIS — M79644 Pain in right finger(s): Secondary | ICD-10-CM | POA: Diagnosis not present

## 2018-01-18 ENCOUNTER — Other Ambulatory Visit: Payer: Self-pay | Admitting: Family Medicine

## 2018-01-19 DIAGNOSIS — S61210A Laceration without foreign body of right index finger without damage to nail, initial encounter: Secondary | ICD-10-CM | POA: Diagnosis not present

## 2018-01-19 DIAGNOSIS — S66320A Laceration of extensor muscle, fascia and tendon of right index finger at wrist and hand level, initial encounter: Secondary | ICD-10-CM | POA: Diagnosis not present

## 2018-02-15 DIAGNOSIS — R635 Abnormal weight gain: Secondary | ICD-10-CM | POA: Diagnosis not present

## 2018-02-15 DIAGNOSIS — N951 Menopausal and female climacteric states: Secondary | ICD-10-CM | POA: Diagnosis not present

## 2018-02-21 DIAGNOSIS — N951 Menopausal and female climacteric states: Secondary | ICD-10-CM | POA: Diagnosis not present

## 2018-02-21 DIAGNOSIS — Z1331 Encounter for screening for depression: Secondary | ICD-10-CM | POA: Diagnosis not present

## 2018-02-21 DIAGNOSIS — Z1339 Encounter for screening examination for other mental health and behavioral disorders: Secondary | ICD-10-CM | POA: Diagnosis not present

## 2018-02-21 DIAGNOSIS — Z72820 Sleep deprivation: Secondary | ICD-10-CM | POA: Diagnosis not present

## 2018-02-21 DIAGNOSIS — Z6830 Body mass index (BMI) 30.0-30.9, adult: Secondary | ICD-10-CM | POA: Diagnosis not present

## 2018-02-21 DIAGNOSIS — E782 Mixed hyperlipidemia: Secondary | ICD-10-CM | POA: Diagnosis not present

## 2018-02-22 DIAGNOSIS — E782 Mixed hyperlipidemia: Secondary | ICD-10-CM | POA: Diagnosis not present

## 2018-02-22 DIAGNOSIS — Z6829 Body mass index (BMI) 29.0-29.9, adult: Secondary | ICD-10-CM | POA: Diagnosis not present

## 2018-03-03 DIAGNOSIS — N951 Menopausal and female climacteric states: Secondary | ICD-10-CM | POA: Diagnosis not present

## 2018-03-03 DIAGNOSIS — E782 Mixed hyperlipidemia: Secondary | ICD-10-CM | POA: Diagnosis not present

## 2018-03-03 DIAGNOSIS — R232 Flushing: Secondary | ICD-10-CM | POA: Diagnosis not present

## 2018-03-10 DIAGNOSIS — M2550 Pain in unspecified joint: Secondary | ICD-10-CM | POA: Diagnosis not present

## 2018-03-10 DIAGNOSIS — Z6829 Body mass index (BMI) 29.0-29.9, adult: Secondary | ICD-10-CM | POA: Diagnosis not present

## 2018-03-17 DIAGNOSIS — E782 Mixed hyperlipidemia: Secondary | ICD-10-CM | POA: Diagnosis not present

## 2018-03-17 DIAGNOSIS — Z8639 Personal history of other endocrine, nutritional and metabolic disease: Secondary | ICD-10-CM | POA: Diagnosis not present

## 2018-03-17 DIAGNOSIS — Z6829 Body mass index (BMI) 29.0-29.9, adult: Secondary | ICD-10-CM | POA: Diagnosis not present

## 2018-03-17 DIAGNOSIS — N951 Menopausal and female climacteric states: Secondary | ICD-10-CM | POA: Diagnosis not present

## 2018-03-28 DIAGNOSIS — Z6829 Body mass index (BMI) 29.0-29.9, adult: Secondary | ICD-10-CM | POA: Diagnosis not present

## 2018-03-28 DIAGNOSIS — Z8639 Personal history of other endocrine, nutritional and metabolic disease: Secondary | ICD-10-CM | POA: Diagnosis not present

## 2018-03-28 DIAGNOSIS — R030 Elevated blood-pressure reading, without diagnosis of hypertension: Secondary | ICD-10-CM | POA: Diagnosis not present

## 2018-03-30 ENCOUNTER — Other Ambulatory Visit: Payer: Self-pay | Admitting: Family Medicine

## 2018-03-31 DIAGNOSIS — Z72820 Sleep deprivation: Secondary | ICD-10-CM | POA: Diagnosis not present

## 2018-03-31 DIAGNOSIS — R232 Flushing: Secondary | ICD-10-CM | POA: Diagnosis not present

## 2018-03-31 DIAGNOSIS — N951 Menopausal and female climacteric states: Secondary | ICD-10-CM | POA: Diagnosis not present

## 2018-03-31 DIAGNOSIS — E782 Mixed hyperlipidemia: Secondary | ICD-10-CM | POA: Diagnosis not present

## 2018-04-05 DIAGNOSIS — N951 Menopausal and female climacteric states: Secondary | ICD-10-CM | POA: Diagnosis not present

## 2018-04-05 DIAGNOSIS — R232 Flushing: Secondary | ICD-10-CM | POA: Diagnosis not present

## 2018-04-05 DIAGNOSIS — Z6829 Body mass index (BMI) 29.0-29.9, adult: Secondary | ICD-10-CM | POA: Diagnosis not present

## 2018-04-05 DIAGNOSIS — M2550 Pain in unspecified joint: Secondary | ICD-10-CM | POA: Diagnosis not present

## 2018-04-12 DIAGNOSIS — R030 Elevated blood-pressure reading, without diagnosis of hypertension: Secondary | ICD-10-CM | POA: Diagnosis not present

## 2018-04-12 DIAGNOSIS — E782 Mixed hyperlipidemia: Secondary | ICD-10-CM | POA: Diagnosis not present

## 2018-04-12 DIAGNOSIS — M2550 Pain in unspecified joint: Secondary | ICD-10-CM | POA: Diagnosis not present

## 2018-04-12 DIAGNOSIS — Z6829 Body mass index (BMI) 29.0-29.9, adult: Secondary | ICD-10-CM | POA: Diagnosis not present

## 2018-04-21 DIAGNOSIS — Z6828 Body mass index (BMI) 28.0-28.9, adult: Secondary | ICD-10-CM | POA: Diagnosis not present

## 2018-04-21 DIAGNOSIS — E782 Mixed hyperlipidemia: Secondary | ICD-10-CM | POA: Diagnosis not present

## 2018-04-28 DIAGNOSIS — Z8639 Personal history of other endocrine, nutritional and metabolic disease: Secondary | ICD-10-CM | POA: Diagnosis not present

## 2018-04-28 DIAGNOSIS — Z6828 Body mass index (BMI) 28.0-28.9, adult: Secondary | ICD-10-CM | POA: Diagnosis not present

## 2018-05-06 DIAGNOSIS — Z6829 Body mass index (BMI) 29.0-29.9, adult: Secondary | ICD-10-CM | POA: Diagnosis not present

## 2018-05-06 DIAGNOSIS — E782 Mixed hyperlipidemia: Secondary | ICD-10-CM | POA: Diagnosis not present

## 2018-06-28 DIAGNOSIS — M2550 Pain in unspecified joint: Secondary | ICD-10-CM | POA: Diagnosis not present

## 2018-06-28 DIAGNOSIS — N951 Menopausal and female climacteric states: Secondary | ICD-10-CM | POA: Diagnosis not present

## 2018-06-28 DIAGNOSIS — R232 Flushing: Secondary | ICD-10-CM | POA: Diagnosis not present

## 2018-06-28 DIAGNOSIS — Z72820 Sleep deprivation: Secondary | ICD-10-CM | POA: Diagnosis not present

## 2018-07-05 DIAGNOSIS — N951 Menopausal and female climacteric states: Secondary | ICD-10-CM | POA: Diagnosis not present

## 2018-07-05 DIAGNOSIS — Z72820 Sleep deprivation: Secondary | ICD-10-CM | POA: Diagnosis not present

## 2018-07-05 DIAGNOSIS — R232 Flushing: Secondary | ICD-10-CM | POA: Diagnosis not present

## 2018-08-04 DIAGNOSIS — Z72820 Sleep deprivation: Secondary | ICD-10-CM | POA: Diagnosis not present

## 2018-08-04 DIAGNOSIS — R232 Flushing: Secondary | ICD-10-CM | POA: Diagnosis not present

## 2018-08-04 DIAGNOSIS — N951 Menopausal and female climacteric states: Secondary | ICD-10-CM | POA: Diagnosis not present

## 2018-11-22 ENCOUNTER — Other Ambulatory Visit: Payer: Self-pay | Admitting: Family Medicine

## 2019-02-28 ENCOUNTER — Encounter: Payer: Self-pay | Admitting: Family Medicine

## 2019-02-28 ENCOUNTER — Ambulatory Visit (INDEPENDENT_AMBULATORY_CARE_PROVIDER_SITE_OTHER): Payer: BC Managed Care – PPO | Admitting: Family Medicine

## 2019-02-28 ENCOUNTER — Other Ambulatory Visit: Payer: Self-pay

## 2019-02-28 VITALS — BP 158/84 | HR 104 | Temp 97.2°F | Resp 16 | Wt 180.0 lb

## 2019-02-28 DIAGNOSIS — S29012A Strain of muscle and tendon of back wall of thorax, initial encounter: Secondary | ICD-10-CM | POA: Diagnosis not present

## 2019-02-28 MED ORDER — TIZANIDINE HCL 4 MG PO TABS: 4.0000 mg | ORAL_TABLET | Freq: Four times a day (QID) | ORAL | 0 refills | Status: DC | PRN

## 2019-02-28 MED ORDER — DICLOFENAC SODIUM 75 MG PO TBEC: 75.0000 mg | DELAYED_RELEASE_TABLET | Freq: Two times a day (BID) | ORAL | 0 refills | Status: DC
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
## 2019-03-11 ENCOUNTER — Other Ambulatory Visit: Payer: Self-pay | Admitting: Family Medicine

## 2019-03-13 ENCOUNTER — Telehealth: Payer: Self-pay | Admitting: Family Medicine

## 2019-03-13 ENCOUNTER — Other Ambulatory Visit: Payer: Self-pay | Admitting: Family Medicine

## 2019-03-13 DIAGNOSIS — S29012A Strain of muscle and tendon of back wall of thorax, initial encounter: Secondary | ICD-10-CM

## 2019-03-13 NOTE — Telephone Encounter (Signed)
Patient was in last week for muscle spasms, they are not better and would like the referral that was mentioned by dr pickard  [phone]

## 2019-03-13 NOTE — Telephone Encounter (Signed)
Please consult PT

## 2019-03-13 NOTE — Telephone Encounter (Signed)
Referral ordered

## 2019-03-15 DIAGNOSIS — Z139 Encounter for screening, unspecified: Secondary | ICD-10-CM | POA: Diagnosis not present

## 2019-03-15 DIAGNOSIS — E559 Vitamin D deficiency, unspecified: Secondary | ICD-10-CM | POA: Diagnosis not present

## 2019-03-15 DIAGNOSIS — D649 Anemia, unspecified: Secondary | ICD-10-CM | POA: Diagnosis not present

## 2019-03-15 DIAGNOSIS — Z124 Encounter for screening for malignant neoplasm of cervix: Secondary | ICD-10-CM | POA: Diagnosis not present

## 2019-03-15 DIAGNOSIS — Z1231 Encounter for screening mammogram for malignant neoplasm of breast: Secondary | ICD-10-CM | POA: Diagnosis not present

## 2019-03-15 DIAGNOSIS — Z01419 Encounter for gynecological examination (general) (routine) without abnormal findings: Secondary | ICD-10-CM | POA: Diagnosis not present

## 2019-04-14 DIAGNOSIS — Z03818 Encounter for observation for suspected exposure to other biological agents ruled out: Secondary | ICD-10-CM | POA: Diagnosis not present

## 2019-05-10 ENCOUNTER — Other Ambulatory Visit: Payer: Self-pay

## 2019-05-10 ENCOUNTER — Ambulatory Visit
Admission: RE | Admit: 2019-05-10 | Discharge: 2019-05-10 | Disposition: A | Discharge status: Home / Self Care | Payer: Self-pay | Source: Ambulatory Visit | Attending: Family Medicine | Admitting: Family Medicine

## 2019-05-10 ENCOUNTER — Ambulatory Visit
Admission: RE | Admit: 2019-05-10 | Discharge: 2019-05-10 | Disposition: A | Discharge status: Home / Self Care | Payer: BLUE CROSS/BLUE SHIELD | Source: Ambulatory Visit | Attending: Family Medicine | Admitting: Family Medicine

## 2019-05-10 ENCOUNTER — Ambulatory Visit (INDEPENDENT_AMBULATORY_CARE_PROVIDER_SITE_OTHER): Payer: BC Managed Care – PPO | Admitting: Family Medicine

## 2019-05-10 ENCOUNTER — Encounter: Payer: Self-pay | Admitting: Family Medicine

## 2019-05-10 VITALS — BP 140/78 | HR 92 | Temp 98.0°F | Resp 14 | Ht 66.0 in | Wt 182.0 lb

## 2019-05-10 DIAGNOSIS — M25512 Pain in left shoulder: Secondary | ICD-10-CM

## 2019-05-10 DIAGNOSIS — M542 Cervicalgia: Secondary | ICD-10-CM | POA: Diagnosis not present

## 2019-05-10 DIAGNOSIS — G8929 Other chronic pain: Secondary | ICD-10-CM

## 2019-05-10 DIAGNOSIS — M47812 Spondylosis without myelopathy or radiculopathy, cervical region: Secondary | ICD-10-CM | POA: Diagnosis not present

## 2019-05-10 MED ORDER — GABAPENTIN 300 MG PO CAPS: 300.0000 mg | ORAL_CAPSULE | Freq: Every day | ORAL | 3 refills | Status: DC

## 2019-05-10 MED ORDER — CYCLOBENZAPRINE HCL 10 MG PO TABS: 10.0000 mg | ORAL_TABLET | Freq: Three times a day (TID) | ORAL | 0 refills | Status: DC | PRN

## 2019-05-10 NOTE — Progress Notes (Signed)
   Subjective:    Patient ID: Karina [name], female    DOB: 04/15/1975, 44 y.o.   MRN: [id number]  Patient presents for L Shoulder Pain (debilitating pain in shoulder)  Patient here with left shoulder pain as well as neck pain.  She was seen back in December by my partner.  She had picked up a generator work but cannot recall any other contributing factor to her pain.  Actually when she picked up the generator did not feel any discomfort at that time.  She has a sharp stabbing pain which is constant at the shoulder blade on the left side that also radiates to the neck as well as down the left upper to mid back.  She initially had some tingling numbness into the left arm but that has resolved.  States that she is in severe debilitating pain most days.  She has difficulty getting dressed during her regular activities because of the discomfort.  She is unable to take prednisone secondary to allergy so she was given diclofenac and tenacity and she saw minimal improvement with these.  She is difficulty sleeping because of that pain.  She did go to chiropractor had adjustments done on her neck and back she think that may have improved the tingling numbness down the arm but no change in the localized pain.  She did contemplate the physical therapy but they cannot get her in at that time.  She had been doing TENS unit heating pad stretching at home and that also did not help      Review Of Systems:  GEN- denies fatigue, fever, weight loss,weakness, recent illness HEENT- denies eye drainage, change in vision, nasal discharge, CVS- denies chest pain, palpitations RESP- denies SOB, cough, wheeze ABD- denies N/V, change in stools, abd pain GU- denies dysuria, hematuria, dribbling, incontinence MSK- +joint pain, +muscle aches, injury Neuro- denies headache, dizziness, syncope, seizure activity       Objective:    BP 140/78   Pulse 92   Temp 98 F (36.7 C) (Temporal)   Resp 14   Ht 5\' 6"  (1.676 m)    Wt 182 lb (82.6 kg)   SpO2 98%   BMI 29.38 kg/m  GEN- NAD, alert and oriented x3 Neck- Supple, Good ROM, C spine NT  CVS- RRR, no murmur RESP-CTAB MSK- FROM upper ext, rotator cuff intact, but pain over left shoulder blade with ROM, TTP over same region, upper and mid back, TTP left cervical paraspinals Neuro- normal tone, sensation in tact, no winged scapula, strength in tact UE EXT- No edema Pulses- Radial 2+        Assessment & Plan:      Problem List Items Addressed This Visit    None    Visit Diagnoses    Neck pain    -  Primary   Concern for pinched nerve, has good ROM upper, obtain image of C spine/shoulder, trial of gabapentin at bedtime, flexeril. next step ortho   Relevant Orders   DG Cervical Spine Complete   DG Shoulder Left   Chronic left shoulder pain       Relevant Medications   gabapentin (NEURONTIN) 300 MG capsule   cyclobenzaprine (FLEXERIL) 10 MG tablet   Other Relevant Orders   DG Cervical Spine Complete   DG Shoulder Left      Note: This dictation was prepared with Dragon dictation along with smaller phrase technology. Any transcriptional errors that result from this process are unintentional.

## 2019-05-10 NOTE — Patient Instructions (Signed)
Gabapentin 300mg  at bedtime  Stop zanaflex  Take Flexeril  [address] [place] [address]  F/U pending results

## 2019-05-11 ENCOUNTER — Other Ambulatory Visit: Payer: Self-pay | Admitting: Family Medicine

## 2019-05-11 DIAGNOSIS — G8929 Other chronic pain: Secondary | ICD-10-CM

## 2019-05-11 DIAGNOSIS — M25512 Pain in left shoulder: Secondary | ICD-10-CM

## 2019-05-11 DIAGNOSIS — M542 Cervicalgia: Secondary | ICD-10-CM

## 2019-05-15 ENCOUNTER — Ambulatory Visit (HOSPITAL_COMMUNITY): Payer: BC Managed Care – PPO | Attending: Family Medicine | Admitting: Physical Therapy

## 2019-05-15 ENCOUNTER — Other Ambulatory Visit: Payer: Self-pay

## 2019-05-15 ENCOUNTER — Encounter (HOSPITAL_COMMUNITY): Payer: Self-pay

## 2019-05-15 DIAGNOSIS — M25512 Pain in left shoulder: Secondary | ICD-10-CM | POA: Insufficient documentation

## 2019-05-15 DIAGNOSIS — R293 Abnormal posture: Secondary | ICD-10-CM | POA: Insufficient documentation

## 2019-05-15 DIAGNOSIS — M542 Cervicalgia: Secondary | ICD-10-CM | POA: Insufficient documentation

## 2019-05-16 ENCOUNTER — Ambulatory Visit (HOSPITAL_COMMUNITY): Payer: BC Managed Care – PPO | Admitting: Physical Therapy

## 2019-05-16 ENCOUNTER — Other Ambulatory Visit: Payer: Self-pay

## 2019-05-16 DIAGNOSIS — M542 Cervicalgia: Secondary | ICD-10-CM | POA: Diagnosis not present

## 2019-05-16 DIAGNOSIS — M25512 Pain in left shoulder: Secondary | ICD-10-CM

## 2019-05-16 DIAGNOSIS — R293 Abnormal posture: Secondary | ICD-10-CM

## 2019-05-16 NOTE — Patient Instructions (Signed)
Access Code: [email or web address] URL: [email or web address] Date: 05/16/2019 Prepared by: Georges Lynch  Exercises Supine Thoracic Mobilization Towel Roll Vertical - 1 reps - 1 sets - 3-5 minutes hold - 2x daily - 7x weekly Seated Thoracic Extension Arms Overhead - 10 reps - 1 sets - 2x daily - 7x weekly Sidelying Mid Thoracic Rotation - 10 reps - 1 sets - 2x daily - 7x weekly

## 2019-05-16 NOTE — Therapy (Signed)
[place], [place], [address] Phone: [phone]   Fax:  [phone]  Physical Therapy Evaluation  Patient Details  Name: Karina [name] MRN: [id number] Date of Birth: 19-May-1975 Referring Provider (PT): Vic Blackbird MD   Encounter Date: 05/16/2019  PT End of Session - 05/16/19 1626    Visit Number  1    Number of Visits  8    Date for PT Re-Evaluation  06/09/19    Authorization Type  [employer], 30 visit limit, no auth req    Authorization Time Period  05/16/19-06/09/19    Progress Note Due on Visit  8    PT Start Time  1520    PT Stop Time  1610    PT Time Calculation (min)  50 min    Activity Tolerance  Patient tolerated treatment well       Past Medical History:  Diagnosis Date  . Abnormal Pap smear   . Anemia   . Anxiety   . GERD (gastroesophageal reflux disease)   . Headache(784.0)   . Ovarian cyst   . Palpitations   . Sinus infection   . Vitamin D deficiency     Past Surgical History:  Procedure Laterality Date  . CERVICAL CONE BIOPSY    . ECTOPIC PREGNANCY SURGERY     ruptured  . TONSILLECTOMY    . US ECHOCARDIOGRAPHY  08-30-2007   EF 60-65%    There were no vitals filed for this visit.   Subjective Assessment - 05/16/19 1526    Subjective  Patient presents to physical therapy with complaint of neck pain and LT shoulder blade pain. Patient says pain began insidiously in December, but pain has flared up recently when attempting to lift a heavy object. Scheduled ortho consult and had xray of neck which showed degeneration and compression. Patient says she has tried ice, heat, new pillows, and chiropractic which she says have not been very helpful. Says she is currently managing pain with gabapentin and Flexeril. Denies current numbness or tingling in UEs, but says she has in the past around December.    Limitations  Sitting;House hold activities;Reading;Lifting    Diagnostic tests  xrays     Patient Stated Goals  to not be in pain    Currently in Pain?  Yes    Pain Score  5     Pain Location  Scapula    Pain Orientation  Left    Pain Descriptors / Indicators  Burning;Sharp;Shooting    Pain Type  Chronic pain    Pain Onset  More than a month ago    Pain Frequency  Constant    Aggravating Factors   Any type of movement    Pain Relieving Factors  Nothing helps    Effect of Pain on Daily Activities  Limits         [facility] PT Assessment - 05/16/19 0001      Assessment   Medical Diagnosis  Neck pain and left shoulder pain     Referring Provider (PT)  Vic Blackbird MD    Onset Date/Surgical Date  --   December 2020   Next MD Visit  None scheduled     Prior Therapy  No      Precautions   Precautions  None      Restrictions   Weight Bearing Restrictions  No      Prior Function   Level of Independence  Independent  Cognition   Overall Cognitive Status  Within Functional Limits for tasks assessed      Observation/Other Assessments   Observations  Noted hypomobility with thoracic extension and LT rotation     Focus on Therapeutic Outcomes (FOTO)   53% limitation       Sensation   Light Touch  Appears Intact      Posture/Postural Control   Posture/Postural Control  Postural limitations    Postural Limitations  Rounded Shoulders;Forward head;Increased thoracic kyphosis      ROM / Strength   AROM / PROM / Strength  AROM;Strength      AROM   Overall AROM Comments  Bilateral shoulder AROM WNL but pain noted with RT shoulder functional ER     AROM Assessment Site  Cervical    Cervical Flexion  --   WFL but with pain in Lt forearm    Cervical Extension  39    Cervical - Right Side Bend  35    Cervical - Left Side Bend  34    Cervical - Right Rotation  53    Cervical - Left Rotation  55      Strength   Overall Strength Comments  Increased LT neck pain with resisted LT shoulder flexion and abduction     Strength Assessment Site  Shoulder    Right/Left  Shoulder  Right;Left    Right Shoulder Flexion  5/5    Right Shoulder ABduction  5/5    Right Shoulder External Rotation  5/5    Left Shoulder Flexion  4+/5    Left Shoulder ABduction  4+/5    Left Shoulder External Rotation  5/5      Palpation   Palpation comment  Mod tenderness to palpation about LT upper trap, medical scapular border                Objective measurements completed on examination: See above findings.      [facility] Adult PT Treatment/Exercise - 05/16/19 0001      Exercises   Exercises  Shoulder;Neck      Neck Exercises: Seated   Neck Retraction  10 reps    Neck Retraction Limitations  increased LT shoulder pain      Shoulder Exercises: Stretch   Other Shoulder Stretches  thoracic rotation and extension excursions x 10 each     Other Shoulder Stretches  supine thoracic mob on towel roll in decompression position 3 min             PT Education - 05/16/19 1633    Education Details  On evaluation findings, POC and HEP    Person(s) Educated  Patient    Methods  Explanation;Handout    Comprehension  Verbalized understanding       PT Short Term Goals - 05/16/19 1632      PT SHORT TERM GOAL #1   Title  Patient will be independent with initial HEP to improve functional outcomes    Time  2    Period  Weeks    Status  New    Target Date  06/02/19        PT Long Term Goals - 05/16/19 1632      PT LONG TERM GOAL #1   Title  Patient will report at least 50% overall improvement in subjective complaint to indicate improvement in ability to perform ADLs.    Time  4    Period  Weeks    Status  New  Target Date  06/09/19      PT LONG TERM GOAL #2   Title  Patient will improve FOTO score to <40% to indicate improvement in functional outcomes    Time  4    Period  Weeks    Status  New    Target Date  06/09/19      PT LONG TERM GOAL #3   Title  Patient will be able sit > 1 hour without increased pain in order to perform ADLs, deskwork,  eating dinner, reading    Time  4    Period  Weeks    Status  New    Target Date  06/09/19      PT LONG TERM GOAL #4   Title  Patient will report average pain at rest not to exceed 3/10 in order to improve ability to perform ADLs and overall quality of life.    Time  4    Period  Weeks    Status  New    Target Date  06/09/19             Plan - 05/16/19 1627    Clinical Impression Statement  Patient is a 44 y.o. female who presents to physical therapy with complaint of neck and left shoulder pain. Patient demonstrates ROM restriction, segmental hypomobility, increased tenderness to palpation and postural abnormalities which are likely contributing to symptoms of pain and are negatively impacting patient ability to perform ADLs. Patient will benefit from skilled physical therapy services to address these deficits to reduce pain and improve level of function with ADLs    Examination-Activity Limitations  Bathing;Lift;Stand;Bed Mobility;Locomotion Level;Bend;Reach Overhead;Caring for Others;Carry;Sit;Sleep;Dressing;Hygiene/Grooming    Examination-Participation Restrictions  Yard Work;Community Activity;Cleaning;Laundry    Stability/Clinical Decision Making  Stable/Uncomplicated    Clinical Decision Making  Low    Rehab Potential  Good    PT Frequency  2x / week    PT Duration  4 weeks    PT Treatment/Interventions  ADLs/Self Care Home Management;Aquatic Therapy;Biofeedback;Cryotherapy;Electrical Stimulation;Ultrasound;Traction;Moist Heat;DME Instruction;Iontophoresis 4mg /ml Dexamethasone;Neuromuscular re-education;Patient/family education;Therapeutic activities;Therapeutic exercise;Functional mobility training;Orthotic Fit/Training;Manual techniques;Compression bandaging;Vasopneumatic Device;Taping;Joint Manipulations;Spinal Manipulations;Splinting;Energy conservation;Dry needling;Passive range of motion    PT Next Visit Plan  Review goals, assess response to HEP. Progress postural  strengthening and thoracic mobility as tolerates. Add manual as needed for thoracic/ cervical pain, restricitons and mobilizations    PT Home Exercise Plan  05/16/19: thoracic extension and rotation excursions, tspine mobs supine lying on towel roll    Consulted and Agree with Plan of Care  Patient       Patient will benefit from skilled therapeutic intervention in order to improve the following deficits and impairments:  Pain, Improper body mechanics, Postural dysfunction, Decreased mobility, Decreased activity tolerance, Decreased range of motion, Decreased strength, Hypomobility, Impaired UE functional use  Visit Diagnosis: Cervicalgia  Left shoulder pain, unspecified chronicity  Abnormal posture     Problem List Patient Active Problem List   Diagnosis Date Noted  . IBS (irritable bowel syndrome) 12/22/2016  . GAD (generalized anxiety disorder) 08/27/2015  . OCD (obsessive compulsive disorder) 08/27/2015  . Depression 08/27/2015  . GERD (gastroesophageal reflux disease) 02/09/2014  . Tobacco use disorder 02/09/2014  . Ovarian cyst   . Abdominal pain 09/16/2011  . 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 History of conization of cervix 09/16/2011  . Palpitations 06/15/2011   4:41 PM, 05/16/19 07/16/19 PT DPT  Physical Therapist with [facility]  [facility]  [phone]   [facility] [address] [facility] [address] [place], [place], [address] Phone: [phone]   Fax:  [phone]  Name: Karina [name] MRN: [id number] Date of Birth: 17-Aug-1975

## 2019-05-18 ENCOUNTER — Telehealth (HOSPITAL_COMMUNITY): Payer: Self-pay

## 2019-05-18 ENCOUNTER — Ambulatory Visit (HOSPITAL_COMMUNITY): Payer: BC Managed Care – PPO

## 2019-05-18 NOTE — Telephone Encounter (Signed)
pt cancelled appt today because she has to run out of town with her husband

## 2019-05-23 ENCOUNTER — Telehealth (HOSPITAL_COMMUNITY): Payer: Self-pay

## 2019-05-23 ENCOUNTER — Ambulatory Visit (HOSPITAL_COMMUNITY): Payer: BC Managed Care – PPO

## 2019-05-23 NOTE — Telephone Encounter (Signed)
pt car is broke down so she has to cancel for today

## 2019-05-25 ENCOUNTER — Ambulatory Visit (HOSPITAL_COMMUNITY): Payer: BC Managed Care – PPO

## 2019-05-25 ENCOUNTER — Telehealth (HOSPITAL_COMMUNITY): Payer: Self-pay

## 2019-05-25 NOTE — Telephone Encounter (Signed)
No show, called and was forwarded to voice message.  Left message concerning missed apt today.  Reminded next apt date and time wiht contact information given.  Did include no show policy in message as well.    Becky Sax, LPTA/CLT; [place] [phone]

## 2019-05-31 ENCOUNTER — Telehealth (HOSPITAL_COMMUNITY): Payer: Self-pay | Admitting: Physical Therapy

## 2019-05-31 NOTE — Telephone Encounter (Signed)
Pt requested to be placed on hold until she can deal with her mother's broken shoulder since she fell. Patient will call back to r/s at a later date.

## 2019-06-01 ENCOUNTER — Ambulatory Visit (HOSPITAL_COMMUNITY): Payer: BC Managed Care – PPO

## 2019-06-06 ENCOUNTER — Ambulatory Visit (HOSPITAL_COMMUNITY): Payer: BC Managed Care – PPO | Admitting: Physical Therapy

## 2019-06-08 ENCOUNTER — Encounter (HOSPITAL_COMMUNITY): Payer: BC Managed Care – PPO

## 2019-07-06 ENCOUNTER — Other Ambulatory Visit: Payer: Self-pay | Admitting: Family Medicine

## 2019-08-01 DIAGNOSIS — N951 Menopausal and female climacteric states: Secondary | ICD-10-CM | POA: Diagnosis not present

## 2019-08-01 DIAGNOSIS — R232 Flushing: Secondary | ICD-10-CM | POA: Diagnosis not present

## 2019-08-01 DIAGNOSIS — Z72820 Sleep deprivation: Secondary | ICD-10-CM | POA: Diagnosis not present

## 2019-08-10 DIAGNOSIS — N951 Menopausal and female climacteric states: Secondary | ICD-10-CM | POA: Diagnosis not present

## 2019-08-10 DIAGNOSIS — R232 Flushing: Secondary | ICD-10-CM | POA: Diagnosis not present

## 2019-08-10 DIAGNOSIS — Z72820 Sleep deprivation: Secondary | ICD-10-CM | POA: Diagnosis not present

## 2019-10-09 DIAGNOSIS — B309 Viral conjunctivitis, unspecified: Secondary | ICD-10-CM | POA: Diagnosis not present

## 2019-10-11 ENCOUNTER — Ambulatory Visit: Payer: BC Managed Care – PPO | Admitting: Family Medicine

## 2019-10-11 ENCOUNTER — Encounter: Payer: Self-pay | Admitting: Family Medicine

## 2019-10-11 ENCOUNTER — Other Ambulatory Visit: Payer: Self-pay

## 2019-10-11 VITALS — BP 140/88 | HR 82 | Temp 97.4°F | Resp 16 | Ht 66.0 in | Wt 179.0 lb

## 2019-10-11 DIAGNOSIS — R5383 Other fatigue: Secondary | ICD-10-CM | POA: Diagnosis not present

## 2019-10-11 DIAGNOSIS — F331 Major depressive disorder, recurrent, moderate: Secondary | ICD-10-CM

## 2019-10-11 DIAGNOSIS — Z13220 Encounter for screening for lipoid disorders: Secondary | ICD-10-CM

## 2019-10-11 DIAGNOSIS — G4730 Sleep apnea, unspecified: Secondary | ICD-10-CM

## 2019-10-11 DIAGNOSIS — K219 Gastro-esophageal reflux disease without esophagitis: Secondary | ICD-10-CM

## 2019-10-11 DIAGNOSIS — F1010 Alcohol abuse, uncomplicated: Secondary | ICD-10-CM

## 2019-10-11 DIAGNOSIS — K580 Irritable bowel syndrome with diarrhea: Secondary | ICD-10-CM

## 2019-10-11 DIAGNOSIS — F411 Generalized anxiety disorder: Secondary | ICD-10-CM

## 2019-10-11 DIAGNOSIS — F17200 Nicotine dependence, unspecified, uncomplicated: Secondary | ICD-10-CM

## 2019-10-11 DIAGNOSIS — J029 Acute pharyngitis, unspecified: Secondary | ICD-10-CM

## 2019-10-11 MED ORDER — MAGIC MOUTHWASH: 5.0000 mL | Freq: Three times a day (TID) | ORAL | 0 refills | Status: DC | PRN

## 2019-10-11 MED ORDER — DICYCLOMINE HCL 20 MG PO TABS: 20.0000 mg | ORAL_TABLET | Freq: Three times a day (TID) | ORAL | 1 refills | Status: DC

## 2019-10-11 MED ORDER — BUPROPION HCL ER (XL) 150 MG PO TB24: 150.0000 mg | ORAL_TABLET | Freq: Every day | ORAL | 1 refills | Status: DC

## 2019-10-11 NOTE — Patient Instructions (Addendum)
Referral to Cognitive therapy Referral for home sleep study  Dicyclomine for your stomach  Start wellbutrin once a day in the morning  F/U 3 weeks for medications

## 2019-10-11 NOTE — Assessment & Plan Note (Signed)
Major depression with anxiety as well as anger issues.  She also smokes.  We discussed some different medications.  Her mother was on Wellbutrin for years which worked well.  She had issues with Pristiq in the past.  We will start her on Wellbutrin 150 mg once a day.  Referral for cognitive behavioral therapy

## 2019-10-11 NOTE — Assessment & Plan Note (Signed)
Chronic IBS worsened in the setting of her anxiety.  I think she would benefit from colonoscopy but she does not feel like this is something she can go through at this time.  We will try her on dicyclomine to help with the bloating and constant bowel movements.  Abdominal exam was benign.  Labs to be obtained.  She can continue over-the-counter H2 blocker as needed for reflux symptoms.

## 2019-10-11 NOTE — Progress Notes (Signed)
Subjective:    Patient ID: Karina [name], female    DOB: 07/30/1975, 44 y.o.   MRN: [id number]  Patient presents for Mouth Irritation (x weeks- sore throat, bumps to back of tongue, burning sensation to mouth) and Anxiety (feels very overwhelmed)  Here with increased stressors and fatigue.  She has had significant anxiety which is uncontrolled.  She also has depressive symptoms and has been medicating with alcohol at night.  She tends to smoke more when she drinks.  She is had ongoing issues for many years.  She has seen a therapist in the past and actually started seeing a psychotherapist a couple of months ago but then states that she blew up at him and never rescheduled.  She knows that she is going down the wrong right especially with her increased alcohol intake.  Her mother was in hospice and passed away recently and her father's health continues to decline.  She states that things are good with her husband and he tends to just leave her alone especially when she is irritable and angry.  She is interested in cognitive therapy she thinks that it will help her anxiety.  She also states that when her nerves are bad she has multiple bowel movements.  She has been diagnosed with IBS.  She is not noted any blood in the stool but does get cramping and bloating.  She does not want to leave the house most days because her stress levels are so high just being out in public makes her when to have a bowel movement. She has had some issues with her reflux as well and she has been taking over-the-counter H2 blocker which helps.  Other concern today are some lesions on her tongue she has some small bumps and sores on the tongue.  She also has a sore throat and noticed that her uvula was swollen.  She is not had any fever no cough or congestion.  She has not been vaccinated for COVID-19.  There has been no loss of taste or smell.   She has been going to [facility] for testosterone pellets states that  it helped with her fatigue the first 2 times but did not help at her last visit.   Would also like to be evaluated for sleep apnea.  For years she wakes up in the middle of her sleep gasping for air she snores very loudly.  She sleeps separated from her husband because of her symptoms.  Review Of Systems:  GEN- + fatigue, fever, weight loss,weakness, recent illness HEENT- denies eye drainage, change in vision, nasal discharge, CVS- denies chest pain, palpitations RESP- denies SOB, cough, wheeze ABD- denies N/V, +change in stools, abd pain GU- denies dysuria, hematuria, dribbling, incontinence MSK- denies joint pain, muscle aches, injury Neuro- denies headache, dizziness, syncope, seizure activity       Objective:    BP 140/88   Pulse 82   Temp (!) 97.4 F (36.3 C) (Temporal)   Resp 16   Ht 5\' 6"  (1.676 m)   Wt 179 lb (81.2 kg)   BMI 28.89 kg/m  GEN- NAD, alert and oriented x3 HEENT- PERRL, EOMI, non injected sclera, pink conjunctiva, MMM, oropharynx mild erythema, minimal swelling uvula, midline, irritated lesions to side of tongue, prominent papillae,  , no sinus tenderness, TM clear no effusion  Neck- Supple, no thyromegaly , no shotty LAD  CVS- RRR, no murmur RESP-CTAB ABD-NABS,soft,NT,ND Psych - depressed affect, not anxious, no SI, well  groomed , GAD 7 11, PHQ 9 score 1 EXT- No edema Pulses- Radial, DP- 2+        Assessment & Plan:      Problem List Items Addressed This Visit      Unprioritized   Depression    Major depression with anxiety as well as anger issues.  She also smokes.  We discussed some different medications.  Her mother was on Wellbutrin for years which worked well.  She had issues with Pristiq in the past.  We will start her on Wellbutrin 150 mg once a day.  Referral for cognitive behavioral therapy      Relevant Medications   buPROPion (WELLBUTRIN XL) 150 MG 24 hr tablet   GAD (generalized anxiety disorder)   Relevant Medications    buPROPion (WELLBUTRIN XL) 150 MG 24 hr tablet   GERD (gastroesophageal reflux disease)   Relevant Medications   magic mouthwash SOLN   dicyclomine (BENTYL) 20 MG tablet   IBS (irritable bowel syndrome)    Chronic IBS worsened in the setting of her anxiety.  I think she would benefit from colonoscopy but she does not feel like this is something she can go through at this time.  We will try her on dicyclomine to help with the bloating and constant bowel movements.  Abdominal exam was benign.  Labs to be obtained.  She can continue over-the-counter H2 blocker as needed for reflux symptoms.      Relevant Medications   magic mouthwash SOLN   dicyclomine (BENTYL) 20 MG tablet   Tobacco use disorder    Other Visit Diagnoses    Other fatigue    -  Primary    multifactorial, most likely related to mood, check labs as none since 2018    Relevant Orders   TSH   CBC with Differential/Platelet   Comprehensive metabolic panel   Iron   Vitamin D, 25-hydroxy   Pharyngitis, unspecified etiology       strep neg, magic mouthwash sent , call for any other symptoms   Relevant Orders   STREP GROUP A AG, W/REFLEX TO CULT   Screening cholesterol level       Relevant Orders   Lipid panel   Sleep apnea, unspecified type       Relevant Orders   Ambulatory referral to Neurology   alchol abuse        She is considering AA meetings       Note: This dictation was prepared with Dragon dictation along with smaller phrase technology. Any transcriptional errors that result from this process are unintentional.

## 2019-10-12 LAB — CBC WITH DIFFERENTIAL/PLATELET
Absolute Monocytes: 648 cells/uL (ref 200–950)
Basophils Absolute: 49 cells/uL (ref 0–200)
Basophils Relative: 0.6 %
Eosinophils Absolute: 82 cells/uL (ref 15–500)
Eosinophils Relative: 1 %
HCT: 45 % (ref 35.0–45.0)
Hemoglobin: 15 g/dL (ref 11.7–15.5)
Lymphs Abs: 2542 cells/uL (ref 850–3900)
MCH: 33.6 pg — ABNORMAL HIGH (ref 27.0–33.0)
MCHC: 33.3 g/dL (ref 32.0–36.0)
MCV: 100.7 fL — ABNORMAL HIGH (ref 80.0–100.0)
MPV: 9.5 fL (ref 7.5–12.5)
Monocytes Relative: 7.9 %
Neutro Abs: 4879 cells/uL (ref 1500–7800)
Neutrophils Relative %: 59.5 %
Platelets: 299 10*3/uL (ref 140–400)
RBC: 4.47 10*6/uL (ref 3.80–5.10)
RDW: 12.8 % (ref 11.0–15.0)
Total Lymphocyte: 31 %
WBC: 8.2 10*3/uL (ref 3.8–10.8)

## 2019-10-12 LAB — IRON: Iron: 180 ug/dL (ref 40–190)

## 2019-10-12 LAB — COMPREHENSIVE METABOLIC PANEL
AG Ratio: 1.9 (calc) (ref 1.0–2.5)
ALT: 26 U/L (ref 6–29)
AST: 27 U/L (ref 10–35)
Albumin: 4.8 g/dL (ref 3.6–5.1)
Alkaline phosphatase (APISO): 73 U/L (ref 31–125)
BUN: 17 mg/dL (ref 7–25)
CO2: 30 mmol/L (ref 20–32)
Calcium: 9.6 mg/dL (ref 8.6–10.2)
Chloride: 107 mmol/L (ref 98–110)
Creat: 0.74 mg/dL (ref 0.50–1.10)
Globulin: 2.5 g/dL (calc) (ref 1.9–3.7)
Glucose, Bld: 82 mg/dL (ref 65–99)
Potassium: 4.8 mmol/L (ref 3.5–5.3)
Sodium: 144 mmol/L (ref 135–146)
Total Bilirubin: 0.5 mg/dL (ref 0.2–1.2)
Total Protein: 7.3 g/dL (ref 6.1–8.1)

## 2019-10-12 LAB — TSH: TSH: 1.51 mIU/L

## 2019-10-12 LAB — LIPID PANEL
Cholesterol: 225 mg/dL — ABNORMAL HIGH (ref <200)
HDL: 45 mg/dL — ABNORMAL LOW (ref >=50)
LDL Cholesterol (Calc): 137 mg/dL (calc) — ABNORMAL HIGH
Non-HDL Cholesterol (Calc): 180 mg/dL (calc) — ABNORMAL HIGH (ref <130)
Total CHOL/HDL Ratio: 5 (calc) — ABNORMAL HIGH (ref <5.0)
Triglycerides: 281 mg/dL — ABNORMAL HIGH (ref <150)

## 2019-10-12 LAB — VITAMIN D 25 HYDROXY (VIT D DEFICIENCY, FRACTURES): Vit D, 25-Hydroxy: 29 ng/mL — ABNORMAL LOW (ref 30–100)

## 2019-10-13 LAB — CULTURE, GROUP A STREP
MICRO NUMBER:: 10786576
SPECIMEN QUALITY:: ADEQUATE

## 2019-10-13 LAB — STREP GROUP A AG, W/REFLEX TO CULT: Streptococcus, Group A Screen (Direct): NOT DETECTED

## 2019-11-01 ENCOUNTER — Telehealth (INDEPENDENT_AMBULATORY_CARE_PROVIDER_SITE_OTHER): Payer: BC Managed Care – PPO | Admitting: Family Medicine

## 2019-11-01 ENCOUNTER — Encounter: Payer: Self-pay | Admitting: Family Medicine

## 2019-11-01 DIAGNOSIS — F331 Major depressive disorder, recurrent, moderate: Secondary | ICD-10-CM

## 2019-11-01 DIAGNOSIS — F411 Generalized anxiety disorder: Secondary | ICD-10-CM

## 2019-11-01 NOTE — Progress Notes (Signed)
Virtual Visit via Video Note  I connected with Karina [name] on 11/01/19 at 12:35 by a video enabled telemedicine application and verified that I am speaking with the correct person using two identifiers.    Pt location: at home   Physician location:  In office, [facility], Milinda Antis MD     On call: patient and physician   I discussed the limitations of evaluation and management by telemedicine and the availability of in person appointments. The patient expressed understanding and agreed to proceed.  History of Present Illness: Telehealth visit to f/u anxiety/depression medication She was started on wellbutrin 150mg  once a day and referred to cognitive behavioral therapy She has not scheduled appt with therapist but plans to do so. She can tell the Wellbutrin has kicked in, she doesn't feel like she is in a fog all the time, doesn't feel as depressed. Has cut down on smoking and drinking Unfortunately she is fighting more with her husband which has been an ongoing issue. He does not want to get marriage therapy  She has not required the bentyl as GI issues improved with the wellbutrin  She did schedule her sleep consult   No SE with the meds    Observations/Objective: NAD noted, normal WOB Psych- stressed appearing, good eye contact, not overly anxious   Assessment and Plan: MDD/ GAD- continue wellbutrin at 150mg  once a day , it is helping with mood she is sleeping well, decreased tobacco and ETOH intake, discussed importance of therapy for herself, she is going to schedule this  No SI with meds F/U in a few months, repeat fasting labs at that time as well   Follow Up Instructions:    I discussed the assessment and treatment plan with the patient. The patient was provided an opportunity to ask questions and all were answered. The patient agreed with the plan and demonstrated an understanding of the instructions.   The patient was advised to call back or  seek an in-person evaluation if the symptoms worsen or if the condition fails to improve as anticipated.  I provided 10 minutes of non-face-to-face time during this encounter. End Time 12:45pm  , MD

## 2019-11-02 ENCOUNTER — Other Ambulatory Visit: Payer: Self-pay | Admitting: Family Medicine

## 2019-11-08 ENCOUNTER — Other Ambulatory Visit: Payer: Self-pay | Admitting: Family Medicine

## 2019-11-20 ENCOUNTER — Institutional Professional Consult (permissible substitution): Payer: BC Managed Care – PPO | Admitting: Neurology

## 2020-01-29 ENCOUNTER — Other Ambulatory Visit: Payer: Self-pay | Admitting: Family Medicine

## 2020-04-08 DIAGNOSIS — R7989 Other specified abnormal findings of blood chemistry: Secondary | ICD-10-CM | POA: Diagnosis not present

## 2020-04-08 DIAGNOSIS — Z139 Encounter for screening, unspecified: Secondary | ICD-10-CM | POA: Diagnosis not present

## 2020-04-08 DIAGNOSIS — N951 Menopausal and female climacteric states: Secondary | ICD-10-CM | POA: Diagnosis not present

## 2020-04-08 DIAGNOSIS — Z8742 Personal history of other diseases of the female genital tract: Secondary | ICD-10-CM | POA: Diagnosis not present

## 2020-04-08 DIAGNOSIS — Z1231 Encounter for screening mammogram for malignant neoplasm of breast: Secondary | ICD-10-CM | POA: Diagnosis not present

## 2020-04-08 DIAGNOSIS — Z01419 Encounter for gynecological examination (general) (routine) without abnormal findings: Secondary | ICD-10-CM | POA: Diagnosis not present

## 2020-04-08 DIAGNOSIS — Z124 Encounter for screening for malignant neoplasm of cervix: Secondary | ICD-10-CM | POA: Diagnosis not present

## 2020-04-08 DIAGNOSIS — Z09 Encounter for follow-up examination after completed treatment for conditions other than malignant neoplasm: Secondary | ICD-10-CM | POA: Diagnosis not present

## 2020-04-22 DIAGNOSIS — R7989 Other specified abnormal findings of blood chemistry: Secondary | ICD-10-CM | POA: Diagnosis not present

## 2020-04-22 DIAGNOSIS — R6882 Decreased libido: Secondary | ICD-10-CM | POA: Diagnosis not present

## 2020-04-22 DIAGNOSIS — I10 Essential (primary) hypertension: Secondary | ICD-10-CM | POA: Diagnosis not present

## 2020-04-22 DIAGNOSIS — N959 Unspecified menopausal and perimenopausal disorder: Secondary | ICD-10-CM | POA: Diagnosis not present

## 2020-04-25 ENCOUNTER — Other Ambulatory Visit: Payer: Self-pay

## 2020-04-25 ENCOUNTER — Ambulatory Visit: Payer: BC Managed Care – PPO | Admitting: Family Medicine

## 2020-04-25 VITALS — BP 158/80 | HR 90 | Temp 97.9°F | Resp 14 | Ht 66.0 in | Wt 188.0 lb

## 2020-04-25 DIAGNOSIS — I10 Essential (primary) hypertension: Secondary | ICD-10-CM

## 2020-04-25 MED ORDER — HYDROCHLOROTHIAZIDE 25 MG PO TABS: 25.0000 mg | ORAL_TABLET | Freq: Every day | ORAL | 3 refills | Status: DC

## 2020-04-25 NOTE — Progress Notes (Signed)
Subjective:    Patient ID: Karina [name], female    DOB: February 14, 1976, 45 y.o.   MRN: [id number]  HPI Patient is here to check her blood pressure.  Blood pressures been extremely high at her gynecologist.  She has had systolic blood pressures in the 180s.  Here today my nurse checked her blood pressure and found her systolic blood pressure to be 329.  After I entered the room and I rechecked her blood pressure and found her systolic blood pressure to be 924 and her diastolic blood pressure to be 268.  She denies any chest pain shortness of breath or dyspnea on exertion.  She denies any headache.  She denies any vision changes.  She denies any oliguria or hematuria.  She is on venlafaxine however her blood pressure was elevated prior to starting venlafaxine.  She is no longer on oral contraceptive pills.   Past Medical History:  Diagnosis Date  . Abnormal Pap smear   . Anemia   . Anxiety   . GERD (gastroesophageal reflux disease)   . Headache(784.0)   . Ovarian cyst   . Palpitations   . Sinus infection   . Vitamin D deficiency    Past Surgical History:  Procedure Laterality Date  . CERVICAL CONE BIOPSY    . ECTOPIC PREGNANCY SURGERY     ruptured  . TONSILLECTOMY    . US ECHOCARDIOGRAPHY  08-30-2007   EF 60-65%   Current Outpatient Medications on File Prior to Visit  Medication Sig Dispense Refill  . ferrous sulfate 325 (65 FE) MG tablet TAKE 1 TABLET BY MOUTH EVERY DAY 100 tablet 1  . fluticasone (FLONASE) 50 MCG/ACT nasal spray SPRAY 2 SPRAYS INTO EACH NOSTRIL EVERY DAY 16 mL 6  . Probiotic Product (ALIGN) 4 MG CAPS 1 CAPSLE DAILY 30 capsule 3  . venlafaxine XR (EFFEXOR-XR) 37.5 MG 24 hr capsule venlafaxine ER 37.5 mg capsule,extended release 24 hr  TAKE 1 CAPSULE BY MOUTH EVERY DAY     No current facility-administered medications on file prior to visit.   Allergies  Allergen Reactions  . Prednisone     "jitters"   Social History   Socioeconomic History  . Marital  status: Married    Spouse name: Not on file  . Number of children: Not on file  . Years of education: Not on file  . Highest education level: Not on file  Occupational History  . Not on file  Tobacco Use  . Smoking status: Former Games developer  . Smokeless tobacco: Never Used  Substance and Sexual Activity  . Alcohol use: Yes    Comment: Daily  . Drug use: Not on file  . Sexual activity: Yes    Birth control/protection: None  Other Topics Concern  . Not on file  Social History Narrative  . Not on file   Social Determinants of Health   Financial Resource Strain: Not on file  Food Insecurity: Not on file  Transportation Needs: Not on file  Physical Activity: Not on file  Stress: Not on file  Social Connections: Not on file  Intimate Partner Violence: Not on file     Review of Systems  All other systems reviewed and are negative.      Objective:   Physical Exam Vitals reviewed.  Constitutional:      General: She is not in acute distress.    Appearance: She is well-developed. She is not diaphoretic.  HENT:     Head: Normocephalic and atraumatic.  Cardiovascular:     Rate and Rhythm: Normal rate and regular rhythm.     Heart sounds: Normal heart sounds. No murmur heard. No friction rub. No gallop.   Pulmonary:     Effort: Pulmonary effort is normal. No respiratory distress.     Breath sounds: Normal breath sounds. No stridor. No wheezing or rales.  Chest:     Chest wall: No tenderness.  Musculoskeletal:     Cervical back: Neck supple.  Lymphadenopathy:     Cervical: No cervical adenopathy.           Assessment & Plan:  Benign essential HTN - Plan: BASIC METABOLIC PANEL WITH GFR  Check a BMP to evaluate for any evidence of hypokalemia to suggest hyperaldosteronism.  Start hydrochlorothiazide 25 mg a day and recheck blood pressure in 1 week.  If blood pressures not responding, we can certainly evaluate for possible secondary causes of hypertension including  renal artery stenosis, Conn syndrome, pheochromocytoma, etc. however about believe this is more likely stress related on top of primary hypertension

## 2020-04-25 NOTE — Addendum Note (Signed)
Addended by: Phillips Odor on: 04/25/2020 05:00 PM   Modules accepted: Orders

## 2020-04-26 LAB — BASIC METABOLIC PANEL WITH GFR
BUN: 21 mg/dL (ref 7–25)
CO2: 30 mmol/L (ref 20–32)
Calcium: 9.8 mg/dL (ref 8.6–10.2)
Chloride: 104 mmol/L (ref 98–110)
Creat: 0.79 mg/dL (ref 0.50–1.10)
GFR, Est African American: 104 mL/min/{1.73_m2} (ref >=60)
GFR, Est Non African American: 90 mL/min/{1.73_m2} (ref >=60)
Glucose, Bld: 103 mg/dL — ABNORMAL HIGH (ref 65–99)
Potassium: 4.1 mmol/L (ref 3.5–5.3)
Sodium: 141 mmol/L (ref 135–146)

## 2020-04-29 ENCOUNTER — Other Ambulatory Visit: Payer: Self-pay | Admitting: Family Medicine

## 2020-04-29 ENCOUNTER — Encounter: Payer: Self-pay | Admitting: Family Medicine

## 2020-04-29 DIAGNOSIS — I10 Essential (primary) hypertension: Secondary | ICD-10-CM | POA: Diagnosis not present

## 2020-04-29 MED ORDER — VALSARTAN 160 MG PO TABS: 160.0000 mg | ORAL_TABLET | Freq: Every day | ORAL | 2 refills | Status: DC

## 2020-05-02 ENCOUNTER — Encounter: Payer: Self-pay | Admitting: Family Medicine

## 2020-05-02 ENCOUNTER — Other Ambulatory Visit: Payer: Self-pay

## 2020-05-02 ENCOUNTER — Ambulatory Visit: Payer: BC Managed Care – PPO | Admitting: Family Medicine

## 2020-05-02 VITALS — BP 158/108 | HR 90 | Temp 97.6°F | Ht 66.0 in | Wt 187.0 lb

## 2020-05-02 DIAGNOSIS — I158 Other secondary hypertension: Secondary | ICD-10-CM | POA: Diagnosis not present

## 2020-05-02 NOTE — Progress Notes (Signed)
Subjective:    Patient ID: Karina [name], female    DOB: 13-Oct-1975, 45 y.o.   MRN: [id number]  Hypertension  04/25/20 Patient is here to check her blood pressure.  Blood pressures been extremely high at her gynecologist.  She has had systolic blood pressures in the 180s.  Here today my nurse checked her blood pressure and found her systolic blood pressure to be 132.  After I entered the room and I rechecked her blood pressure and found her systolic blood pressure to be 440 and her diastolic blood pressure to be 102.  She denies any chest pain shortness of breath or dyspnea on exertion.  She denies any headache.  She denies any vision changes.  She denies any oliguria or hematuria.  She is on venlafaxine however her blood pressure was elevated prior to starting venlafaxine.  She is no longer on oral contraceptive pills.  At that time, my plan was: Check a BMP to evaluate for any evidence of hypokalemia to suggest hyperaldosteronism.  Start hydrochlorothiazide 25 mg a day and recheck blood pressure in 1 week.  If blood pressures not responding, we can certainly evaluate for possible secondary causes of hypertension including renal artery stenosis, Conn syndrome, pheochromocytoma, etc. however about believe this is more likely stress related on top of primary hypertension  05/02/20 Patient called back and stated that she was getting extremely nauseated, having fatigue, and heartburn taking hydrochlorothiazide.  Therefore we stop the medication and switch to valsartan 160 mg daily.  She is only been on the medicine for 3 days but she states that her systolic blood pressure has dropped from 180 down to 158.  She is getting 150 at home.  Therefore her blood pressure is trending down.  Is tolerating the new medication without difficulty.  Her gynecologist has referred her to a cardiologist and also mentioned that she wanted her checked for primary hyperaldosteronism.  Her most recent BMP was normal   Past  Medical History:  Diagnosis Date  . Abnormal Pap smear   . Anemia   . Anxiety   . GERD (gastroesophageal reflux disease)   . Headache(784.0)   . Ovarian cyst   . Palpitations   . Sinus infection   . Vitamin D deficiency    Past Surgical History:  Procedure Laterality Date  . CERVICAL CONE BIOPSY    . ECTOPIC PREGNANCY SURGERY     ruptured  . TONSILLECTOMY    . US ECHOCARDIOGRAPHY  08-30-2007   EF 60-65%   Current Outpatient Medications on File Prior to Visit  Medication Sig Dispense Refill  . ferrous sulfate 325 (65 FE) MG tablet TAKE 1 TABLET BY MOUTH EVERY DAY 100 tablet 1  . fluticasone (FLONASE) 50 MCG/ACT nasal spray SPRAY 2 SPRAYS INTO EACH NOSTRIL EVERY DAY 16 mL 6  . Probiotic Product (ALIGN) 4 MG CAPS 1 CAPSLE DAILY 30 capsule 3  . valsartan (DIOVAN) 160 MG tablet Take 1 tablet (160 mg total) by mouth daily. 30 tablet 2  . venlafaxine XR (EFFEXOR-XR) 37.5 MG 24 hr capsule venlafaxine ER 37.5 mg capsule,extended release 24 hr  TAKE 1 CAPSULE BY MOUTH EVERY DAY     No current facility-administered medications on file prior to visit.   Allergies  Allergen Reactions  . Prednisone     "jitters"   Social History   Socioeconomic History  . Marital status: Married    Spouse name: Not on file  . Number of children: Not on file  .  Years of education: Not on file  . Highest education level: Not on file  Occupational History  . Not on file  Tobacco Use  . Smoking status: Former Games developer  . Smokeless tobacco: Never Used  Substance and Sexual Activity  . Alcohol use: Yes    Comment: Daily  . Drug use: Not on file  . Sexual activity: Yes    Birth control/protection: None  Other Topics Concern  . Not on file  Social History Narrative  . Not on file   Social Determinants of Health   Financial Resource Strain: Not on file  Food Insecurity: Not on file  Transportation Needs: Not on file  Physical Activity: Not on file  Stress: Not on file  Social Connections:  Not on file  Intimate Partner Violence: Not on file     Review of Systems  All other systems reviewed and are negative.      Objective:   Physical Exam Vitals reviewed.  Constitutional:      General: She is not in acute distress.    Appearance: She is well-developed. She is not diaphoretic.  HENT:     Head: Normocephalic and atraumatic.  Cardiovascular:     Rate and Rhythm: Normal rate and regular rhythm.     Heart sounds: Normal heart sounds. No murmur heard. No friction rub. No gallop.   Pulmonary:     Effort: Pulmonary effort is normal. No respiratory distress.     Breath sounds: Normal breath sounds. No stridor. No wheezing or rales.  Chest:     Chest wall: No tenderness.  Musculoskeletal:     Cervical back: Neck supple.  Lymphadenopathy:     Cervical: No cervical adenopathy.           Assessment & Plan:  Other secondary hypertension - Plan: Aldosterone + renin activity w/ ratio I will screen for hyperaldosteronism by checking plasma aldosterone concentration to plasma renin activity ratio.  That being said I feel that this is unlikely given her normal sodium and normal potassium.  Other possibilities on the differential diagnosis would be pheochromocytoma, renal artery stenosis, obstructive sleep apnea, or primary hypertension.  Await the results of her blood pressure over the next 10 days.  If not less than 140 systolic at that point I would either add amlodipine or resume hydrochlorothiazide.  We discussed a renal artery ultrasound to evaluate for renal artery stenosis but she defers that at the present time

## 2020-05-03 ENCOUNTER — Telehealth: Payer: Self-pay

## 2020-05-03 NOTE — Telephone Encounter (Signed)
NOTES ON FILE FROM  DR Osborn Coho [phone], SENT REFERRAL TO SCHEDULING

## 2020-05-23 ENCOUNTER — Other Ambulatory Visit: Payer: Self-pay | Admitting: Family Medicine

## 2020-06-03 ENCOUNTER — Encounter: Payer: Self-pay | Admitting: Nurse Practitioner

## 2020-06-03 ENCOUNTER — Other Ambulatory Visit: Payer: Self-pay

## 2020-06-03 ENCOUNTER — Ambulatory Visit: Payer: BC Managed Care – PPO | Admitting: Nurse Practitioner

## 2020-06-03 VITALS — BP 164/120 | HR 107 | Temp 98.6°F | Ht 66.0 in | Wt 194.0 lb

## 2020-06-03 DIAGNOSIS — H1031 Unspecified acute conjunctivitis, right eye: Secondary | ICD-10-CM

## 2020-06-03 MED ORDER — ERYTHROMYCIN 5 MG/GM OP OINT: 1.0000 "application " | TOPICAL_OINTMENT | Freq: Four times a day (QID) | OPHTHALMIC | 0 refills | Status: AC

## 2020-06-03 NOTE — Progress Notes (Signed)
Subjective:    Patient ID: Karina [name], female    DOB: 1975/09/04, 45 y.o.   MRN: [id number]  HPI: Karina [name] is a 45 y.o. female presenting for  Chief Complaint  Patient presents with  . Eye Problem    Right eye irritation using pink eye med otc for pain and drainage   EYE REDNESS Duration:  days Involved eye:  right Onset: gradual - started in corner and spread Severity: moderate  Quality: knife sticking in eye Foreign body sensation:no Visual impairment: no Eye redness: yes Discharge: yes Crusting or matting of eyelids: yes Swelling: yes Photophobia: yes Itching: yes Tearing: yes Headache: no Floaters: no URI symptoms: no Contact lens use: yes; uses extended wear Close contacts with similar problems: no Eye trauma: no Aggravating factors: nothing tried Alleviating factors: nothing  Treatments attempted: pink eye OTC  Allergies  Allergen Reactions  . Prednisone     "jitters"    Outpatient Encounter Medications as of 06/03/2020  Medication Sig  . erythromycin ophthalmic ointment Place 1 application into the right eye 4 (four) times daily for 5 days. Place 0.5 inch of ointment into the right eye four times daily for 5 days.  . ferrous sulfate 325 (65 FE) MG tablet TAKE 1 TABLET BY MOUTH EVERY DAY  . fluticasone (FLONASE) 50 MCG/ACT nasal spray SPRAY 2 SPRAYS INTO EACH NOSTRIL EVERY DAY  . Probiotic Product (ALIGN) 4 MG CAPS 1 CAPSLE DAILY  . valsartan (DIOVAN) 160 MG tablet Take 1 tablet (160 mg total) by mouth daily.  Marland Kitchen venlafaxine XR (EFFEXOR-XR) 37.5 MG 24 hr capsule venlafaxine ER 37.5 mg capsule,extended release 24 hr  TAKE 1 CAPSULE BY MOUTH EVERY DAY   No facility-administered encounter medications on file as of 06/03/2020.    Patient Active Problem List   Diagnosis Date Noted  . IBS (irritable bowel syndrome) 12/22/2016  . GAD (generalized anxiety disorder) 08/27/2015  . OCD (obsessive compulsive disorder) 08/27/2015  . Depression  08/27/2015  . GERD (gastroesophageal reflux disease) 02/09/2014  . Tobacco use disorder 02/09/2014  . Ovarian cyst   . Abdominal pain 09/16/2011  . 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 History of conization of cervix 09/16/2011  . Palpitations 06/15/2011    Past Medical History:  Diagnosis Date  . Abnormal Pap smear   . Anemia   . Anxiety   . GERD (gastroesophageal reflux disease)   . Headache(784.0)   . Ovarian cyst   . Palpitations   . Sinus infection   . Vitamin D deficiency     Relevant past medical, surgical, family and social history reviewed and updated as indicated. Interim medical history since our last visit reviewed.  Review of Systems Per HPI unless specifically indicated above     Objective:    BP (!) 164/120   Pulse (!) 107   Temp 98.6 F (37 C)   Ht 5\' 6"  (1.676 m)   Wt 194 lb (88 kg)   SpO2 96%   BMI 31.31 kg/m   Wt Readings from Last 3 Encounters:  06/03/20 194 lb (88 kg)  05/02/20 187 lb (84.8 kg)  04/25/20 188 lb (85.3 kg)    Physical Exam Vitals and nursing note reviewed.  Constitutional:      Appearance: Normal appearance.  HENT:     Head: Normocephalic and atraumatic.     Right Ear: External ear normal.     Left Ear: External  ear normal.     Nose: Nose normal. No congestion.     Mouth/Throat:     Mouth: Mucous membranes are moist.     Pharynx: Oropharynx is clear. No oropharyngeal exudate or posterior oropharyngeal erythema.  Eyes:     General: No scleral icterus.       Right eye: Discharge present.        Left eye: No discharge.     Extraocular Movements: Extraocular movements intact.     Right eye: Normal extraocular motion.     Left eye: Normal extraocular motion.     Conjunctiva/sclera:     Right eye: Right conjunctiva is injected. No chemosis, exudate or hemorrhage.    Pupils: Pupils are equal, round, and reactive to light.  Neurological:     Mental Status: She is  alert.        Assessment & Plan:  1. Acute bacterial conjunctivitis of right eye Acute.  Likely bacterial given length of symptoms and symptom onset coupled with eyelid matting in the morning.  Will treat with erythromycin ophthalmic ointment 4 times daily or every 6 hours for 5 days.  Return to clinic if symptoms do not improve.  - erythromycin ophthalmic ointment; Place 1 application into the right eye 4 (four) times daily for 5 days. Place 0.5 inch of ointment into the right eye four times daily for 5 days.  Dispense: 3.5 g; Refill: 0    Follow up plan: Return if symptoms worsen or fail to improve.

## 2020-06-05 ENCOUNTER — Encounter: Payer: Self-pay | Admitting: Nurse Practitioner

## 2020-06-05 ENCOUNTER — Telehealth: Payer: Self-pay

## 2020-06-05 NOTE — Telephone Encounter (Signed)
Copy, paste, edit to pt mychart

## 2020-06-05 NOTE — Telephone Encounter (Signed)
Reviewed pictures.  If she has been taking the drops for 2 days now, we would expect her eye to be getting better.  If her eye is not getting better, I am wondering if she may have a viral conjunctivitis and not bacterial and if the antibiotics are making her eye worse and causing more irritation.  Please have her stop them if that is the case and she can try over the counter drops that are lubricants.  Please ensure she has not resumed wearing contacts - she should avoid this until her eyes are not longer red.

## 2020-06-07 ENCOUNTER — Encounter: Payer: Self-pay | Admitting: Nurse Practitioner

## 2020-06-07 ENCOUNTER — Telehealth: Payer: Self-pay | Admitting: Nurse Practitioner

## 2020-06-07 DIAGNOSIS — H5789 Other specified disorders of eye and adnexa: Secondary | ICD-10-CM

## 2020-06-07 NOTE — Telephone Encounter (Signed)
Called and spoke with patient.  She reports her eye pain is worse and she is starting to have blurred vision.  She was able to see an ophthalmologist via telehealth who thinks she may have an ulceration due to wearing contacts.  She is on eye drops.  Will place an urgent referral to ophthalmology.

## 2020-06-07 NOTE — Telephone Encounter (Signed)
What follow for this pt. Still having issues with the eye

## 2020-06-07 NOTE — Telephone Encounter (Signed)
Called patient and left message to return call to clinic so we can discuss her eye

## 2020-06-07 NOTE — Telephone Encounter (Signed)
Patient still having issues with eye; called to ask Anitra to call her back. Please advise at [phone]

## 2020-06-07 NOTE — Addendum Note (Signed)
Addended by: Cathlean Marseilles A on: 06/07/2020 02:37 PM   Modules accepted: Orders

## 2020-06-07 NOTE — Telephone Encounter (Signed)
See telephone encounter.  Urgent referral to ophthalmology placed.

## 2020-06-07 NOTE — Telephone Encounter (Signed)
Message has been sent to [facility] for follow up advice

## 2020-06-09 ENCOUNTER — Other Ambulatory Visit: Payer: Self-pay

## 2020-06-09 ENCOUNTER — Encounter: Payer: Self-pay | Admitting: Emergency Medicine

## 2020-06-09 ENCOUNTER — Ambulatory Visit (HOSPITAL_COMMUNITY): Admit: 2020-06-09 | Discharge status: Absent without leave | Payer: BC Managed Care – PPO

## 2020-06-09 ENCOUNTER — Ambulatory Visit
Admission: EM | Admit: 2020-06-09 | Discharge: 2020-06-09 | Disposition: A | Discharge status: Home / Self Care | Payer: BC Managed Care – PPO

## 2020-06-09 DIAGNOSIS — R030 Elevated blood-pressure reading, without diagnosis of hypertension: Secondary | ICD-10-CM

## 2020-06-09 DIAGNOSIS — H209 Unspecified iridocyclitis: Secondary | ICD-10-CM

## 2020-06-09 DIAGNOSIS — H5711 Ocular pain, right eye: Secondary | ICD-10-CM

## 2020-06-09 DIAGNOSIS — I10 Essential (primary) hypertension: Secondary | ICD-10-CM

## 2020-06-09 MED ORDER — PREDNISOLONE ACETATE 1 % OP SUSP: 1.0000 [drp] | Freq: Four times a day (QID) | OPHTHALMIC | 0 refills | Status: DC

## 2020-06-09 NOTE — ED Triage Notes (Signed)
Pt here for right eye pain and redness x 6 days that she has seen optho via tele health and also her PCP; given two meds without relief

## 2020-06-09 NOTE — ED Provider Notes (Signed)
[facility]   MRN: [id number] DOB: 10/25/1975  Subjective:   Karina [name] is a 45 y.o. female presenting for 8-day history of acute onset persistent worsening right eye pain, redness, intermittent "fuzzy" vision.  Patient wears contact lenses but is not currently.  She has had 2 video visits and has undergone a course of erythromycin ointment followed by ciprofloxacin ophthalmic.  She has failed both treatments.  Unfortunately she was not able to get in with her ophthalmologist.  Denies history of glaucoma.  Patient does have a history of hypertension, has a cardiologist that manages her and has an appointment coming up soon.  She is on valsartan and has not forgotten to take her medications.  However, she admits that she has not been sleeping very much due to her high pain.  No current facility-administered medications for this encounter.  Current Outpatient Medications:  .  ciprofloxacin (CILOXAN) 0.3 % ophthalmic solution, Place into the right eye every 2 (two) hours. Administer 1 drop, every 2 hours, while awake, for 2 days. Then 1 drop, every 4 hours, while awake, for the next 5 days., Disp: , Rfl:  .  erythromycin ophthalmic ointment, Place into the right eye at bedtime., Disp: , Rfl:  .  prednisoLONE acetate (PRED FORTE) 1 % ophthalmic suspension, Place 1 drop into the right eye 4 (four) times daily., Disp: 5 mL, Rfl: 0 .  ferrous sulfate 325 (65 FE) MG tablet, TAKE 1 TABLET BY MOUTH EVERY DAY, Disp: 100 tablet, Rfl: 1 .  fluticasone (FLONASE) 50 MCG/ACT nasal spray, SPRAY 2 SPRAYS INTO EACH NOSTRIL EVERY DAY, Disp: 16 mL, Rfl: 6 .  Probiotic Product (ALIGN) 4 MG CAPS, 1 CAPSLE DAILY, Disp: 30 capsule, Rfl: 3 .  valsartan (DIOVAN) 160 MG tablet, Take 1 tablet (160 mg total) by mouth daily., Disp: 30 tablet, Rfl: 2 .  venlafaxine XR (EFFEXOR-XR) 37.5 MG 24 hr capsule, venlafaxine ER 37.5 mg capsule,extended release 24 hr  TAKE 1 CAPSULE BY MOUTH EVERY DAY, Disp: , Rfl:     Allergies  Allergen Reactions  . Prednisone     "jitters"    Past Medical History:  Diagnosis Date  . Abnormal Pap smear   . Anemia   . Anxiety   . GERD (gastroesophageal reflux disease)   . Headache(784.0)   . Ovarian cyst   . Palpitations   . Sinus infection   . Vitamin D deficiency      Past Surgical History:  Procedure Laterality Date  . CERVICAL CONE BIOPSY    . ECTOPIC PREGNANCY SURGERY     ruptured  . TONSILLECTOMY    . US ECHOCARDIOGRAPHY  08-30-2007   EF 60-65%    History reviewed. No pertinent family history.  Social History   Tobacco Use  . Smoking status: Former Games developer  . Smokeless tobacco: Never Used  Substance Use Topics  . Alcohol use: Yes    Comment: Daily    ROS   Objective:   Vitals: BP (!) 186/89 (BP Location: Left Arm)   Pulse 89   Temp 98.8 F (37.1 C) (Oral)   Resp 18   SpO2 98%   Physical Exam Constitutional:      General: She is not in acute distress.    Appearance: Normal appearance. She is well-developed. She is not ill-appearing, toxic-appearing or diaphoretic.  HENT:     Head: Normocephalic and atraumatic.     Right Ear: External ear normal.     Left Ear: External ear normal.  Nose: Nose normal.     Mouth/Throat:     Mouth: Mucous membranes are moist.     Pharynx: Oropharynx is clear.  Eyes:     General: Lids are normal. Lids are everted, no foreign bodies appreciated. No scleral icterus.       Right eye: Discharge (Clear and watery) present. No foreign body or hordeolum.        Left eye: No discharge.     Extraocular Movements: Extraocular movements intact.     Right eye: Normal extraocular motion and no nystagmus.     Conjunctiva/sclera:     Right eye: Right conjunctiva is injected. No chemosis, exudate or hemorrhage.    Pupils: Pupils are equal, round, and reactive to light.     Comments: No red ring.  Cardiovascular:     Rate and Rhythm: Normal rate.  Pulmonary:     Effort: Pulmonary effort is normal.   Skin:    General: Skin is warm and dry.  Neurological:     General: No focal deficit present.     Mental Status: She is alert and oriented to person, place, and time.     Motor: No weakness.     Coordination: Coordination normal.     Gait: Gait normal.     Deep Tendon Reflexes: Reflexes normal.  Psychiatric:        Mood and Affect: Mood normal.        Behavior: Behavior normal.        Thought Content: Thought content normal.        Judgment: Judgment normal.     Eye Exam: Eyelids everted and swept for foreign body. The eye was anesthetized with 2 drops of tetracaine and stained with fluorescein. Examination under woods lamp does not reveal a foreign body or area of increased stain uptake. The eye was then irrigated copiously with saline.   Assessment and Plan :   PDMP not reviewed this encounter.  1. Iritis   2. Acute right eye pain   3. Elevated blood pressure reading   4. Essential hypertension     Case discussed with Dr. Georga Hacking, recommended managing with prednisolone ophthalmic 4 times daily for iritis.  We will have patient follow-up with him this week. Counseled patient on potential for adverse effects with medications prescribed/recommended today, ER and return-to-clinic precautions discussed, patient verbalized understanding.    Wallis Bamberg, PA-C 06/09/20 1423

## 2020-06-11 DIAGNOSIS — H209 Unspecified iridocyclitis: Secondary | ICD-10-CM | POA: Diagnosis not present

## 2020-06-20 ENCOUNTER — Encounter: Payer: Self-pay | Admitting: Cardiovascular Disease

## 2020-06-20 NOTE — Progress Notes (Signed)
Cardiology Office Note:    Date:  06/21/2020   ID:  Karina [name], DOB March 31, 1975, MRN [id number]  PCP:  Salley Scarlet, MD   [facility]  Cardiologist:  Sanaiyah Kirchhoff  Advanced Practice Provider:  No care team member to display Electrophysiologist:  None   Referring MD: Osborn Coho, MD   Chief Complaint  Patient presents with  . Palpitations     June 21, 2020   Karina [name] is a 45 y.o. female with a hx of HTN,  She has recently been having some palpitations and we were asked to see her for these palpitations by Dr. Osborn Coho   I saw her many years ago . She is here for elevated BP  Has recently improved her diet - lower sodium . Has gone through menopause 2 years ago  Lots of hormonal irregularieis Wt is 189 lbs.  Basically unchaged.  No regular exercise , walks on occasion Works on her mini-farm    Primary MD    Past Medical History:  Diagnosis Date  . Abnormal Pap smear   . Anemia   . Anxiety   . BMI 25.0-25.9,adult   . Ectopic pregnancy   . GERD (gastroesophageal reflux disease)   . Headache(784.0)   . Hypertension   . Ovarian cyst   . Palpitations   . Sinus infection   . Vitamin D deficiency     Past Surgical History:  Procedure Laterality Date  . CERVICAL CONE BIOPSY    . ECTOPIC PREGNANCY SURGERY     ruptured  . TONSILLECTOMY    . US ECHOCARDIOGRAPHY  08-30-2007   EF 60-65%    Current Medications: Current Meds  Medication Sig  . carvedilol (COREG) 6.25 MG tablet Take 1 tablet (6.25 mg total) by mouth 2 (two) times daily.  . hydrochlorothiazide (HYDRODIURIL) 25 MG tablet Take 0.5 tablets (12.5 mg total) by mouth daily.  . potassium chloride (KLOR-CON) 10 MEQ tablet Take 1 tablet (10 mEq total) by mouth daily.  . [DISCONTINUED] fenofibrate (TRICOR) 145 MG tablet Take 1 tablet (145 mg total) by mouth daily.  . [DISCONTINUED] rosuvastatin (CRESTOR) 10 MG tablet Take 1 tablet (10 mg total) by mouth daily.      Allergies:   Prednisone   Social History   Socioeconomic History  . Marital status: Married    Spouse name: Not on file  . Number of children: Not on file  . Years of education: Not on file  . Highest education level: Not on file  Occupational History  . Not on file  Tobacco Use  . Smoking status: Former Games developer  . Smokeless tobacco: Never Used  Substance and Sexual Activity  . Alcohol use: Yes    Comment: Daily  . Drug use: Not on file  . Sexual activity: Yes    Birth control/protection: None  Other Topics Concern  . Not on file  Social History Narrative  . Not on file   Social Determinants of Health   Financial Resource Strain: Not on file  Food Insecurity: Not on file  Transportation Needs: Not on file  Physical Activity: Not on file  Stress: Not on file  Social Connections: Not on file     Family History: The patient's family history includes Hypertension in her father.  ROS:   Please see the history of present illness.     All other systems reviewed and are negative.  EKGs/Labs/Other Studies Reviewed:    The following studies were  reviewed today:   EKG: June 21, 2020: Normal sinus rhythm at 95.  No ST or T wave changes.  Recent Labs: 10/11/2019: ALT 26; Hemoglobin 15.0; Platelets 299; TSH 1.51 04/25/2020: BUN 21; Creat 0.79; Potassium 4.1; Sodium 141  Recent Lipid Panel    Component Value Date/Time   CHOL 225 (H) 10/11/2019 1312   TRIG 281 (H) 10/11/2019 1312   HDL 45 (L) 10/11/2019 1312   CHOLHDL 5.0 (H) 10/11/2019 1312   VLDL 36 (H) 08/28/2015 1105   LDLCALC 137 (H) 10/11/2019 1312     Risk Assessment/Calculations:       Physical Exam:    VS:  BP (!) 162/108   Pulse 95   Ht 5\' 5"  (1.651 m)   Wt 189 lb (85.7 kg)   SpO2 98%   BMI 31.45 kg/m     Wt Readings from Last 3 Encounters:  06/21/20 189 lb (85.7 kg)  06/03/20 194 lb (88 kg)  05/02/20 187 lb (84.8 kg)     GEN:  Well nourished, well developed in no acute distress HEENT:  Normal NECK: No JVD; No carotid bruits LYMPHATICS: No lymphadenopathy CARDIAC: RRR, no murmurs, rubs, gallops RESPIRATORY:  Clear to auscultation without rales, wheezing or rhonchi  ABDOMEN: Soft, non-tender, non-distended MUSCULOSKELETAL:  No edema; No deformity  SKIN: Warm and dry NEUROLOGIC:  Alert and oriented x 3 PSYCHIATRIC:  Normal affect   ASSESSMENT:    1. Shortness of breath   2. Precordial pain   3. Hypertension, unspecified type    PLAN:       1. Hypertension: Mackenzy  presents today with elevated blood pressure.  She is anxious and mad about her situation.  She has been under lots of stress with the death of her mom and the fact that her father is now living in a nursing home.  She has been started on valsartan but really did not give it much of a chance.  We will have her go ahead and take the valsartan 160 mg a day.  We will add HCTZ 12.5 mg a day to start.  Anticipate increasing the HCTZ to 25 mg after we see her in a couple weeks.  We will add potassium chloride 10 mEq a day.  Because she is also tachycardic we will also add carvedilol 6.25 mg twice a day.  We will see her in the office in several weeks either as a work in visit or in hypertension clinic to continue to titrate her medications.  We will check a basic metabolic profile at that time.  She is leaving for [place] in 3 to 4 weeks so I am hoping that we can get her blood pressure better controlled before that time.    Medication Adjustments/Labs and Tests Ordered: Current medicines are reviewed at length with the patient today.  Concerns regarding medicines are outlined above.  Orders Placed This Encounter  Procedures  . Lipid panel  . ALT  . Basic metabolic panel  . EKG 12-Lead   Meds ordered this encounter  Medications  . DISCONTD: rosuvastatin (CRESTOR) 10 MG tablet    Sig: Take 1 tablet (10 mg total) by mouth daily.    Dispense:  90 tablet    Refill:  3  . DISCONTD: fenofibrate (TRICOR) 145 MG  tablet    Sig: Take 1 tablet (145 mg total) by mouth daily.    Dispense:  90 tablet    Refill:  3  . hydrochlorothiazide (HYDRODIURIL) 25 MG tablet  Sig: Take 0.5 tablets (12.5 mg total) by mouth daily.    Dispense:  45 tablet    Refill:  3  . potassium chloride (KLOR-CON) 10 MEQ tablet    Sig: Take 1 tablet (10 mEq total) by mouth daily.    Dispense:  90 tablet    Refill:  3  . carvedilol (COREG) 6.25 MG tablet    Sig: Take 1 tablet (6.25 mg total) by mouth 2 (two) times daily.    Dispense:  180 tablet    Refill:  3    Patient Instructions   Medication Instructions:  Your physician has recommended you make the following change in your medication:   START HCTZ 25mg - take 1/2 tablet daily START Potassium daily START Coreg 6.25 two times a day *If you need a refill on your cardiac medications before your next appointment, please call your pharmacy*   Lab Work: TODAY-lipids, alt  07/02/2020-bmet If you have labs (blood work) drawn today and your tests are completely normal, you will receive your results only by: 07/04/2020 MyChart Message (if you have MyChart) OR . A paper copy in the mail If you have any lab test that is abnormal or we need to change your treatment, we will call you to review the results.   Testing/Procedures: none   Follow-Up: At [facility], you and your health needs are our priority.  As part of our continuing mission to provide you with exceptional heart care, we have created designated Provider Care Teams.  These Care Teams include your primary Cardiologist (physician) and Advanced Practice Providers (APPs -  Physician Assistants and Nurse Practitioners) who all work together to provide you with the care you need, when you need it.   Your next appointment:  07/02/2020 at 1:40pm    The format for your next appointment:   In Person  Provider:   You may see Dr. 07/04/2020 or one of the following Advanced Practice Providers on your designated Care  Team:    Elease Hashimoto, PA-C  Tereso Newcomer, Chelsea Aus        Signed, [place], MD  06/21/2020 5:19 PM    [facility]

## 2020-06-21 ENCOUNTER — Other Ambulatory Visit: Payer: Self-pay

## 2020-06-21 ENCOUNTER — Ambulatory Visit: Payer: BC Managed Care – PPO | Admitting: Cardiovascular Disease

## 2020-06-21 ENCOUNTER — Encounter: Payer: Self-pay | Admitting: Cardiovascular Disease

## 2020-06-21 VITALS — BP 162/108 | HR 95 | Ht 65.0 in | Wt 189.0 lb

## 2020-06-21 DIAGNOSIS — R0602 Shortness of breath: Secondary | ICD-10-CM | POA: Diagnosis not present

## 2020-06-21 DIAGNOSIS — R002 Palpitations: Secondary | ICD-10-CM | POA: Diagnosis not present

## 2020-06-21 DIAGNOSIS — R072 Precordial pain: Secondary | ICD-10-CM | POA: Diagnosis not present

## 2020-06-21 DIAGNOSIS — I10 Essential (primary) hypertension: Secondary | ICD-10-CM

## 2020-06-21 MED ORDER — POTASSIUM CHLORIDE ER 10 MEQ PO TBCR: 10.0000 meq | EXTENDED_RELEASE_TABLET | Freq: Every day | ORAL | 3 refills | Status: DC

## 2020-06-21 MED ORDER — HYDROCHLOROTHIAZIDE 25 MG PO TABS: 12.5000 mg | ORAL_TABLET | Freq: Every day | ORAL | 3 refills | Status: DC

## 2020-06-21 MED ORDER — FENOFIBRATE 145 MG PO TABS: 145.0000 mg | ORAL_TABLET | Freq: Every day | ORAL | 3 refills | Status: DC

## 2020-06-21 MED ORDER — ROSUVASTATIN CALCIUM 10 MG PO TABS: 10.0000 mg | ORAL_TABLET | Freq: Every day | ORAL | 3 refills | Status: DC

## 2020-06-21 MED ORDER — CARVEDILOL 6.25 MG PO TABS: 6.2500 mg | ORAL_TABLET | Freq: Two times a day (BID) | ORAL | 3 refills | Status: DC

## 2020-06-21 NOTE — Patient Instructions (Addendum)
  Medication Instructions:  Your physician has recommended you make the following change in your medication:   START HCTZ 25mg - take 1/2 tablet daily START Potassium daily START Coreg 6.25 two times a day *If you need a refill on your cardiac medications before your next appointment, please call your pharmacy*   Lab Work: TODAY-lipids, alt  07/02/2020-bmet If you have labs (blood work) drawn today and your tests are completely normal, you will receive your results only by: 07/04/2020 MyChart Message (if you have MyChart) OR . A paper copy in the mail If you have any lab test that is abnormal or we need to change your treatment, we will call you to review the results.   Testing/Procedures: none   Follow-Up: At [facility], you and your health needs are our priority.  As part of our continuing mission to provide you with exceptional heart care, we have created designated Provider Care Teams.  These Care Teams include your primary Cardiologist (physician) and Advanced Practice Providers (APPs -  Physician Assistants and Nurse Practitioners) who all work together to provide you with the care you need, when you need it.   Your next appointment:  07/02/2020 at 1:40pm    The format for your next appointment:   In Person  Provider:   You may see Dr. 07/04/2020 or one of the following Advanced Practice Providers on your designated Care Team:    Elease Hashimoto, PA-C  Vin Bellefonte, [place]

## 2020-06-22 LAB — LIPID PANEL
Chol/HDL Ratio: 5 ratio — ABNORMAL HIGH (ref 0.0–4.4)
Cholesterol, Total: 218 mg/dL — ABNORMAL HIGH (ref 100–199)
HDL: 44 mg/dL (ref >39)
LDL Chol Calc (NIH): 119 mg/dL — ABNORMAL HIGH (ref 0–99)
Triglycerides: 314 mg/dL — ABNORMAL HIGH (ref 0–149)
VLDL Cholesterol Cal: 55 mg/dL — ABNORMAL HIGH (ref 5–40)

## 2020-06-22 LAB — ALT: ALT: 21 IU/L (ref 0–32)

## 2020-07-02 ENCOUNTER — Other Ambulatory Visit: Payer: Self-pay

## 2020-07-02 ENCOUNTER — Ambulatory Visit: Payer: BC Managed Care – PPO | Admitting: Cardiovascular Disease

## 2020-07-02 ENCOUNTER — Encounter: Payer: Self-pay | Admitting: Cardiovascular Disease

## 2020-07-02 ENCOUNTER — Other Ambulatory Visit: Payer: BC Managed Care – PPO | Admitting: *Deleted

## 2020-07-02 VITALS — BP 144/100 | HR 90 | Ht 65.0 in | Wt 188.0 lb

## 2020-07-02 DIAGNOSIS — I10 Essential (primary) hypertension: Secondary | ICD-10-CM

## 2020-07-02 DIAGNOSIS — E782 Mixed hyperlipidemia: Secondary | ICD-10-CM | POA: Diagnosis not present

## 2020-07-02 DIAGNOSIS — E785 Hyperlipidemia, unspecified: Secondary | ICD-10-CM | POA: Insufficient documentation

## 2020-07-02 MED ORDER — HYDROCHLOROTHIAZIDE 25 MG PO TABS: 25.0000 mg | ORAL_TABLET | Freq: Every day | ORAL | 3 refills | Status: DC

## 2020-07-02 MED ORDER — CARVEDILOL 12.5 MG PO TABS: 12.5000 mg | ORAL_TABLET | Freq: Two times a day (BID) | ORAL | 3 refills | Status: DC

## 2020-07-02 NOTE — Progress Notes (Signed)
Cardiology Office Note:    Date:  07/02/2020   ID:  Karina [name], DOB 07-17-1975, MRN [id number]  PCP:  Donita Brooks, MD   [facility]  Cardiologist:  Marletta Bousquet  Advanced Practice Provider:  No care team member to display Electrophysiologist:  None   Referring MD: Salley Scarlet, MD   Chief Complaint  Patient presents with  . Hypertension     June 21, 2020   Karina [name] is a 45 y.o. female with a hx of HTN,  She has recently been having some palpitations and we were asked to see her for these palpitations by Dr. Osborn Coho   I saw her many years ago . She is here for elevated BP  Has recently improved her diet - lower sodium . Has gone through menopause 2 years ago  Lots of hormonal irregularieis Wt is 189 lbs.  Basically unchaged.  No regular exercise , walks on occasion Works on her mini-farm    Primary MD    July 02, 2020: Karina [name] is seen today for follow up of her HTN BP is much better .  Diet has improved some.  She is lost about a pound.  She is also exercising more.  We discussed her high triglyceride levels on her high  cholesterol levels.  Encouraged her to continue to work on improved diet exercise and weight loss.   Past Medical History:  Diagnosis Date  . Abnormal Pap smear   . Anemia   . Anxiety   . BMI 25.0-25.9,adult   . Ectopic pregnancy   . GERD (gastroesophageal reflux disease)   . Headache(784.0)   . Hypertension   . Ovarian cyst   . Palpitations   . Sinus infection   . Vitamin D deficiency     Past Surgical History:  Procedure Laterality Date  . CERVICAL CONE BIOPSY    . ECTOPIC PREGNANCY SURGERY     ruptured  . TONSILLECTOMY    . US ECHOCARDIOGRAPHY  08-30-2007   EF 60-65%    Current Medications: Current Meds  Medication Sig  . carvedilol (COREG) 12.5 MG tablet Take 1 tablet (12.5 mg total) by mouth 2 (two) times daily.  Marland Kitchen erythromycin ophthalmic ointment Place into the right eye at  bedtime.  . ferrous sulfate 325 (65 FE) MG tablet TAKE 1 TABLET BY MOUTH EVERY DAY  . fluticasone (FLONASE) 50 MCG/ACT nasal spray SPRAY 2 SPRAYS INTO EACH NOSTRIL EVERY DAY  . hydrochlorothiazide (HYDRODIURIL) 25 MG tablet Take 1 tablet (25 mg total) by mouth daily.  . potassium chloride (KLOR-CON) 10 MEQ tablet Take 1 tablet (10 mEq total) by mouth daily.  . Probiotic Product (ALIGN) 4 MG CAPS 1 CAPSLE DAILY  . valsartan (DIOVAN) 160 MG tablet Take 1 tablet (160 mg total) by mouth daily.  Marland Kitchen venlafaxine XR (EFFEXOR-XR) 37.5 MG 24 hr capsule venlafaxine ER 37.5 mg capsule,extended release 24 hr  TAKE 1 CAPSULE BY MOUTH EVERY DAY  . [DISCONTINUED] carvedilol (COREG) 6.25 MG tablet Take 1 tablet (6.25 mg total) by mouth 2 (two) times daily.  . [DISCONTINUED] hydrochlorothiazide (HYDRODIURIL) 25 MG tablet Take 0.5 tablets (12.5 mg total) by mouth daily.  . [DISCONTINUED] prednisoLONE acetate (PRED FORTE) 1 % ophthalmic suspension Place 1 drop into the right eye 4 (four) times daily.     Allergies:   Prednisone   Social History   Socioeconomic History  . Marital status: Married    Spouse name: Not on file  .  Number of children: Not on file  . Years of education: Not on file  . Highest education level: Not on file  Occupational History  . Not on file  Tobacco Use  . Smoking status: Former Games developer  . Smokeless tobacco: Never Used  Substance and Sexual Activity  . Alcohol use: Yes    Comment: Daily  . Drug use: Not on file  . Sexual activity: Yes    Birth control/protection: None  Other Topics Concern  . Not on file  Social History Narrative  . Not on file   Social Determinants of Health   Financial Resource Strain: Not on file  Food Insecurity: Not on file  Transportation Needs: Not on file  Physical Activity: Not on file  Stress: Not on file  Social Connections: Not on file     Family History: The patient's family history includes Hypertension in her father.  ROS:    Please see the history of present illness.     All other systems reviewed and are negative.  EKGs/Labs/Other Studies Reviewed:    The following studies were reviewed today:   EKG:    Recent Labs: 10/11/2019: Hemoglobin 15.0; Platelets 299; TSH 1.51 04/25/2020: BUN 21; Creat 0.79; Potassium 4.1; Sodium 141 06/21/2020: ALT 21  Recent Lipid Panel    Component Value Date/Time   CHOL 218 (H) 06/21/2020 1538   TRIG 314 (H) 06/21/2020 1538   HDL 44 06/21/2020 1538   CHOLHDL 5.0 (H) 06/21/2020 1538   CHOLHDL 5.0 (H) 10/11/2019 1312   VLDL 36 (H) 08/28/2015 1105   LDLCALC 119 (H) 06/21/2020 1538   LDLCALC 137 (H) 10/11/2019 1312     Risk Assessment/Calculations:       Physical Exam:    Physical Exam: Blood pressure (!) 144/100, pulse 90, height 5\' 5"  (1.651 m), weight 188 lb (85.3 kg), SpO2 98 %.  GEN:  Well nourished, well developed in no acute distress HEENT: Norma NECK: No JVD; No carotid bruits LYMPHATICS: No lymphadenopathy CARDIAC: RRR , no murmurs, rubs, gallops RESPIRATORY:  Clear to auscultation without rales, wheezing or rhonchi  ABDOMEN: Soft, non-tender, non-distended MUSCULOSKELETAL:  No edema; No deformity  SKIN: Warm and dry NEUROLOGIC:  Alert and oriented x 3   ASSESSMENT:    1. Primary hypertension   2. Mixed hyperlipidemia    PLAN:       1.  Hypertension: Blood pressure is much better-144/100.  We will increase the carvedilol to 12.5 mg twice a day.  We will increase the HCTZ to 25 mg a day.  Continue current dose of potassium.  Continue Diovan 160 mg a day.  I encouraged her to work on diet, exercise, weight loss.  We will continue to gradually titrate her medications upward as needed.  2.  Hyperlipidemia: Her lipids were fairly elevated at her last visit.  We will continue with good diet, exercise, weight loss program.  We will consider starting a statin or other medications at her next office visit.   Medication Adjustments/Labs and Tests  Ordered: Current medicines are reviewed at length with the patient today.  Concerns regarding medicines are outlined above.  Orders Placed This Encounter  Procedures  . ALT  . Basic metabolic panel  . Lipid panel   Meds ordered this encounter  Medications  . carvedilol (COREG) 12.5 MG tablet    Sig: Take 1 tablet (12.5 mg total) by mouth 2 (two) times daily.    Dispense:  180 tablet    Refill:  3  .  hydrochlorothiazide (HYDRODIURIL) 25 MG tablet    Sig: Take 1 tablet (25 mg total) by mouth daily.    Dispense:  90 tablet    Refill:  3    Patient Instructions  Medication Instructions:  Your physician has recommended you make the following change in your medication:   INCREASE Coreg to 12.5mg  twice a day INCREASE HCTZ to 25mg  daily  *If you need a refill on your cardiac medications before your next appointment, please call your pharmacy*   Lab Work: lipids, alt, bmet Your physician recommends that you return for lab work in: 3 months on the day of or a few days before your office visit with Dr. .  You will need to FAST for this appointment - nothing to eat or drink after midnight the night before except water.   If you have labs (blood work) drawn today and your tests are completely normal, you will receive your results only by: Elease Hashimoto MyChart Message (if you have MyChart) OR . A paper copy in the mail If you have any lab test that is abnormal or we need to change your treatment, we will call you to review the results.   Testing/Procedures: none   Follow-Up: At [facility], you and your health needs are our priority.  As part of our continuing mission to provide you with exceptional heart care, we have created designated Provider Care Teams.  These Care Teams include your primary Cardiologist (physician) and Advanced Practice Providers (APPs -  Physician Assistants and Nurse Practitioners) who all work together to provide you with the care you need, when you need  it.  We recommend signing up for the patient portal called "MyChart".  Sign up information is provided on this After Visit Summary.  MyChart is used to connect with patients for Virtual Visits (Telemedicine).  Patients are able to view lab/test results, encounter notes, upcoming appointments, etc.  Non-urgent messages can be sent to your provider as well.   To learn more about what you can do with MyChart, go to [facility].    Your next appointment:  10/09/2020 at 2:00pm 3 month(s)  The format for your next appointment:   In Person  Provider:   You may see Dr. 12/09/2020 or one of the following Advanced Practice Providers on your designated Care Team:    Elease Hashimoto, PA-C  Tereso Newcomer, [place] Aus       Signed, [place], MD  07/02/2020 6:19 PM    [facility]

## 2020-07-02 NOTE — Patient Instructions (Addendum)
Medication Instructions:  Your physician has recommended you make the following change in your medication:   INCREASE Coreg to 12.5mg  twice a day INCREASE HCTZ to 25mg  daily  *If you need a refill on your cardiac medications before your next appointment, please call your pharmacy*   Lab Work: lipids, alt, bmet Your physician recommends that you return for lab work in: 3 months on the day of or a few days before your office visit with Dr. .  You will need to FAST for this appointment - nothing to eat or drink after midnight the night before except water.   If you have labs (blood work) drawn today and your tests are completely normal, you will receive your results only by: Elease Hashimoto MyChart Message (if you have MyChart) OR . A paper copy in the mail If you have any lab test that is abnormal or we need to change your treatment, we will call you to review the results.   Testing/Procedures: none   Follow-Up: At [facility], you and your health needs are our priority.  As part of our continuing mission to provide you with exceptional heart care, we have created designated Provider Care Teams.  These Care Teams include your primary Cardiologist (physician) and Advanced Practice Providers (APPs -  Physician Assistants and Nurse Practitioners) who all work together to provide you with the care you need, when you need it.  We recommend signing up for the patient portal called "MyChart".  Sign up information is provided on this After Visit Summary.  MyChart is used to connect with patients for Virtual Visits (Telemedicine).  Patients are able to view lab/test results, encounter notes, upcoming appointments, etc.  Non-urgent messages can be sent to your provider as well.   To learn more about what you can do with MyChart, go to [facility].    Your next appointment:  10/09/2020 at 2:00pm 3 month(s)  The format for your next appointment:   In Person  Provider:   You may see Dr.  12/09/2020 or one of the following Advanced Practice Providers on your designated Care Team:    Elease Hashimoto, PA-C  Vin McClelland, [place]

## 2020-07-03 LAB — BASIC METABOLIC PANEL
BUN/Creatinine Ratio: 33 — ABNORMAL HIGH (ref 9–23)
BUN: 24 mg/dL (ref 6–24)
CO2: 23 mmol/L (ref 20–29)
Calcium: 9.7 mg/dL (ref 8.7–10.2)
Chloride: 104 mmol/L (ref 96–106)
Creatinine, Ser: 0.73 mg/dL (ref 0.57–1.00)
Glucose: 95 mg/dL (ref 65–99)
Potassium: 4.6 mmol/L (ref 3.5–5.2)
Sodium: 143 mmol/L (ref 134–144)
eGFR: 103 mL/min/{1.73_m2} (ref >59)

## 2020-10-01 IMAGING — CR DG FINGER INDEX 2+V*R*
3 series · 3 of 3 positions shown · non-contrast
Comparison: None.

CLINICAL DATA: Laceration to R index finger X tonight

EXAM:
RIGHT INDEX FINGER 2+V

[finger ap]
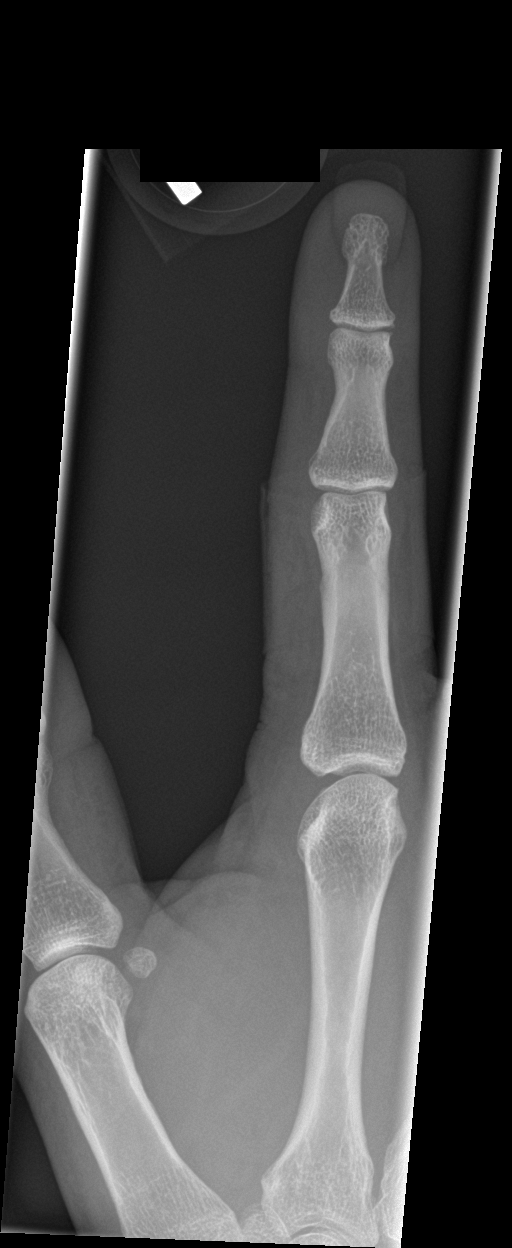

[finger obl]
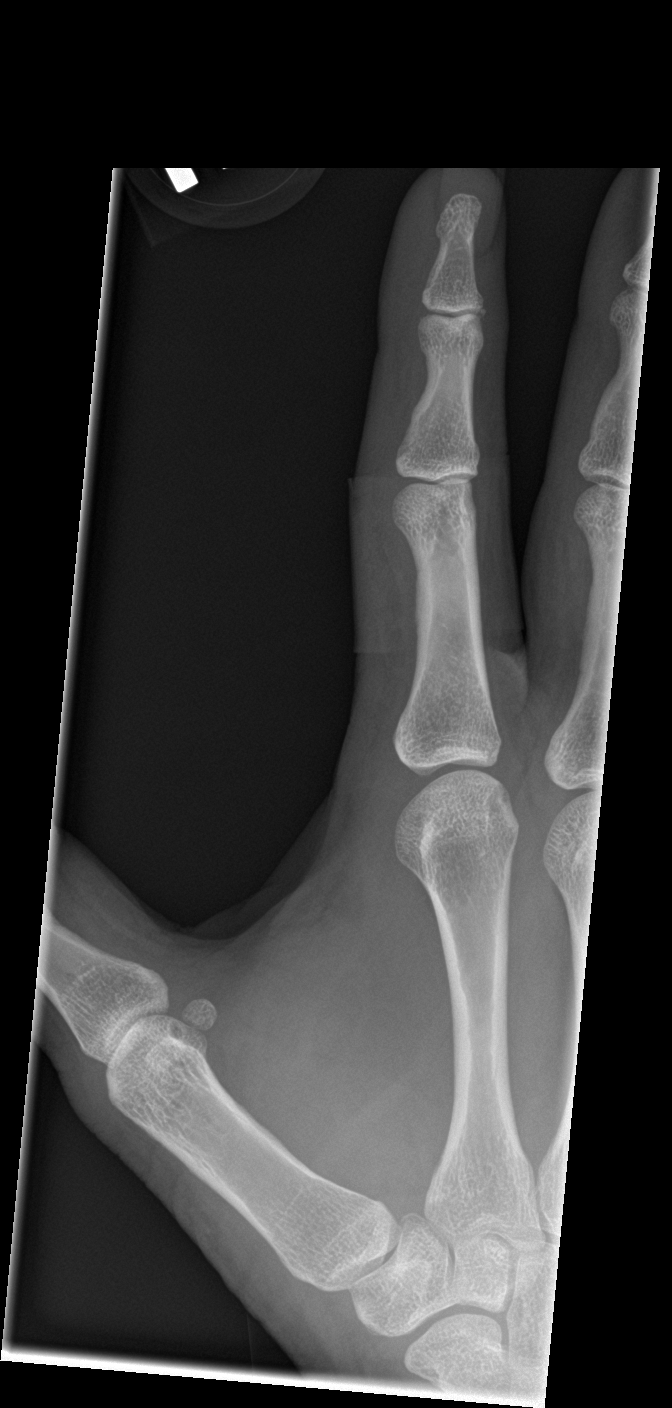

[finger lat]
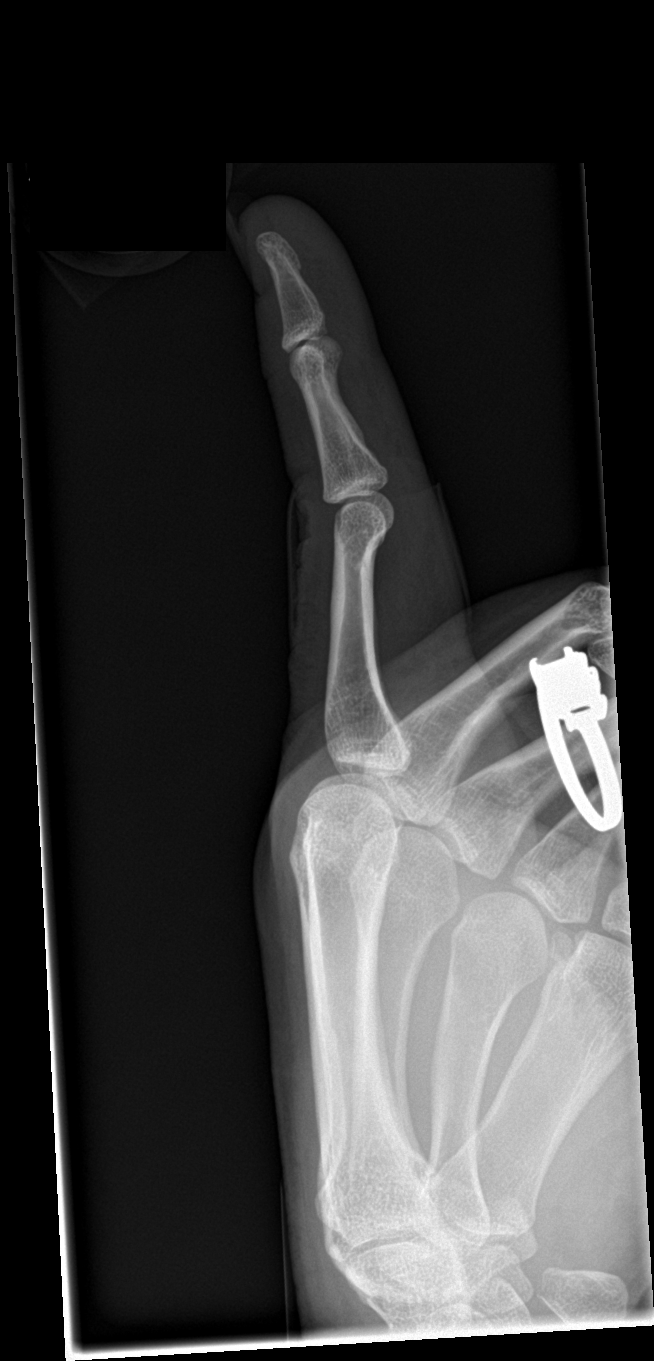

[3 of 3 positions shown; findings below may reference images not displayed]

FINDINGS: There is no evidence of fracture or dislocation. There is no
evidence of arthropathy or other focal bone abnormality. No
radiodense foreign body. No subcutaneous gas. Soft tissues are
unremarkable.
IMPRESSION: Negative.

## 2020-10-08 ENCOUNTER — Encounter: Payer: Self-pay | Admitting: Cardiovascular Disease

## 2020-10-08 NOTE — Progress Notes (Signed)
This encounter was created in error - please disregard.

## 2020-10-09 ENCOUNTER — Encounter: Payer: BC Managed Care – PPO | Admitting: Cardiovascular Disease

## 2020-10-09 ENCOUNTER — Other Ambulatory Visit: Payer: BC Managed Care – PPO

## 2020-10-21 DIAGNOSIS — R1031 Right lower quadrant pain: Secondary | ICD-10-CM | POA: Diagnosis not present

## 2020-10-21 DIAGNOSIS — R1011 Right upper quadrant pain: Secondary | ICD-10-CM | POA: Diagnosis not present

## 2020-10-21 DIAGNOSIS — K5900 Constipation, unspecified: Secondary | ICD-10-CM | POA: Diagnosis not present

## 2020-10-21 DIAGNOSIS — K625 Hemorrhage of anus and rectum: Secondary | ICD-10-CM | POA: Diagnosis not present

## 2021-01-10 ENCOUNTER — Ambulatory Visit: Payer: BC Managed Care – PPO | Admitting: Cardiovascular Disease

## 2021-05-05 DIAGNOSIS — G4733 Obstructive sleep apnea (adult) (pediatric): Secondary | ICD-10-CM | POA: Diagnosis not present

## 2021-05-13 DIAGNOSIS — G4733 Obstructive sleep apnea (adult) (pediatric): Secondary | ICD-10-CM | POA: Diagnosis not present

## 2021-08-13 ENCOUNTER — Encounter (HOSPITAL_BASED_OUTPATIENT_CLINIC_OR_DEPARTMENT_OTHER): Payer: Self-pay

## 2021-08-13 ENCOUNTER — Emergency Department (HOSPITAL_BASED_OUTPATIENT_CLINIC_OR_DEPARTMENT_OTHER)
Admission: EM | Admit: 2021-08-13 | Discharge: 2021-08-13 | Disposition: A | Discharge status: Home / Self Care | Payer: BC Managed Care – PPO | Attending: Emergency Medicine | Admitting: Emergency Medicine

## 2021-08-13 ENCOUNTER — Emergency Department (HOSPITAL_BASED_OUTPATIENT_CLINIC_OR_DEPARTMENT_OTHER): Payer: BC Managed Care – PPO | Admitting: Radiology

## 2021-08-13 ENCOUNTER — Other Ambulatory Visit: Payer: Self-pay

## 2021-08-13 DIAGNOSIS — S76012A Strain of muscle, fascia and tendon of left hip, initial encounter: Secondary | ICD-10-CM | POA: Diagnosis not present

## 2021-08-13 DIAGNOSIS — X58XXXA Exposure to other specified factors, initial encounter: Secondary | ICD-10-CM | POA: Insufficient documentation

## 2021-08-13 DIAGNOSIS — M25552 Pain in left hip: Secondary | ICD-10-CM | POA: Diagnosis not present

## 2021-08-13 DIAGNOSIS — M5432 Sciatica, left side: Secondary | ICD-10-CM | POA: Diagnosis not present

## 2021-08-13 LAB — HCG, SERUM, QUALITATIVE: Preg, Serum: NEGATIVE

## 2021-08-13 MED ORDER — KETOROLAC TROMETHAMINE 60 MG/2ML IM SOLN
60.0000 mg | Freq: Once | INTRAMUSCULAR | Status: AC
  Administered 2021-08-13: 60 mg via INTRAMUSCULAR
  Filled 2021-08-13: qty 2

## 2021-08-13 MED ORDER — MELOXICAM 15 MG PO TABS: 15.0000 mg | ORAL_TABLET | Freq: Every day | ORAL | 0 refills | Status: AC

## 2021-08-13 MED ORDER — OXYCODONE-ACETAMINOPHEN 5-325 MG PO TABS: 1.0000 | ORAL_TABLET | ORAL | 0 refills | Status: AC | PRN

## 2021-08-13 MED ORDER — DEXAMETHASONE SODIUM PHOSPHATE 10 MG/ML IJ SOLN
10.0000 mg | Freq: Once | INTRAMUSCULAR | Status: AC
Start: 2021-08-13 — End: 2021-08-13
  Administered 2021-08-13: 10 mg via INTRAMUSCULAR
  Filled 2021-08-13: qty 1

## 2021-08-13 NOTE — ED Notes (Signed)
Patient transported to X-ray via w/c at this time  
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
## 2021-08-13 NOTE — ED Provider Notes (Signed)
[facility] DEPT Provider Note   CSN: [id number] Arrival date & time: 08/13/21  0201     History  Chief Complaint  Patient presents with   Leg Pain   Back Pain    Karina [name] is a 46 y.o. female.  Patient presents to the emergency department for evaluation of left hip and leg.  Patient reports that she initially injured herself when she was performing a sexual maneuver approximately a week ago.  She reports that pain resolved after couple days and she was able to do her normal activities.  She then had sex again 4 days ago and has been having progressively worsening pain since.  Pain starts behind the left hip and radiates to the groin and thigh.  No change in bowel or bladder function.  No weakness of the leg.      Home Medications Prior to Admission medications   Medication Sig Start Date End Date Taking? Authorizing Provider  meloxicam (MOBIC) 15 MG tablet Take 1 tablet (15 mg total) by mouth daily. 08/13/21  Yes Wisam Siefring, Gwenyth Allegra, MD  oxyCODONE-acetaminophen (PERCOCET) 5-325 MG tablet Take 1-2 tablets by mouth every 4 (four) hours as needed. 08/13/21  Yes Sarit Sparano, Gwenyth Allegra, MD  carvedilol (COREG) 12.5 MG tablet Take 1 tablet (12.5 mg total) by mouth 2 (two) times daily. 07/02/20   Nahser, Wonda Cheng, MD  erythromycin ophthalmic ointment Place into the right eye at bedtime.    [provider]  ferrous sulfate 325 (65 FE) MG tablet TAKE 1 TABLET BY MOUTH EVERY DAY 01/29/20   [place], Modena Nunnery, MD  fluticasone [facility]) 50 MCG/ACT nasal spray SPRAY 2 SPRAYS INTO EACH NOSTRIL EVERY DAY 05/27/20   Susy Frizzle, MD  hydrochlorothiazide (HYDRODIURIL) 25 MG tablet Take 1 tablet (25 mg total) by mouth daily. 07/02/20   Nahser, Wonda Cheng, MD  potassium chloride (KLOR-CON) 10 MEQ tablet Take 1 tablet (10 mEq total) by mouth daily. 06/21/20   Nahser, Wonda Cheng, MD  Probiotic Product (ALIGN) 4 MG CAPS 1 CAPSLE DAILY 12/22/16   [place], Modena Nunnery, MD   valsartan (DIOVAN) 160 MG tablet Take 1 tablet (160 mg total) by mouth daily. 04/29/20   Susy Frizzle, MD  venlafaxine XR (EFFEXOR-XR) 37.5 MG 24 hr capsule venlafaxine ER 37.5 mg capsule,extended release 24 hr  TAKE 1 CAPSULE BY MOUTH EVERY DAY    [provider]      Allergies    Prednisone    Review of Systems   Review of Systems  Physical Exam Updated Vital Signs BP (!) 181/127   Pulse (!) 116   Temp 98.3 F (36.8 C) (Oral)   Resp 17   Ht 5\' 4"  (1.626 m)   Wt 86.2 kg   SpO2 100%   BMI 32.61 kg/m  Physical Exam Vitals and nursing note reviewed.  Constitutional:      General: She is not in acute distress.    Appearance: She is well-developed.  HENT:     Head: Normocephalic and atraumatic.     Mouth/Throat:     Mouth: Mucous membranes are moist.  Eyes:     General: Vision grossly intact. Gaze aligned appropriately.     Extraocular Movements: Extraocular movements intact.     Conjunctiva/sclera: Conjunctivae normal.  Cardiovascular:     Rate and Rhythm: Normal rate and regular rhythm.     Pulses: Normal pulses.     Heart sounds: Normal heart sounds, S1 normal and S2 normal. No  murmur heard.   No friction rub. No gallop.  Pulmonary:     Effort: Pulmonary effort is normal. No respiratory distress.     Breath sounds: Normal breath sounds.  Abdominal:     General: Bowel sounds are normal.     Palpations: Abdomen is soft.     Tenderness: There is no abdominal tenderness. There is no guarding or rebound.     Hernia: No hernia is present.  Musculoskeletal:        General: No swelling.     Cervical back: Full passive range of motion without pain, normal range of motion and neck supple. No spinous process tenderness or muscular tenderness. Normal range of motion.     Right lower leg: No edema.     Left lower leg: No edema.  Skin:    General: Skin is warm and dry.     Capillary Refill: Capillary refill takes less than 2 seconds.     Findings: No  ecchymosis, erythema, rash or wound.  Neurological:     General: No focal deficit present.     Mental Status: She is alert and oriented to person, place, and time.     GCS: GCS eye subscore is 4. GCS verbal subscore is 5. GCS motor subscore is 6.     Cranial Nerves: Cranial nerves 2-12 are intact.     Sensory: Sensation is intact.     Motor: Motor function is intact.     Coordination: Coordination is intact.  Psychiatric:        Attention and Perception: Attention normal.        Mood and Affect: Mood normal.        Speech: Speech normal.        Behavior: Behavior normal.    ED Results / Procedures / Treatments   Labs (all labs ordered are listed, but only abnormal results are displayed) Labs Reviewed  HCG, SERUM, QUALITATIVE    EKG None  Radiology DG Hip Unilat With Pelvis 2-3 Views Left  Result Date: 08/13/2021 CLINICAL DATA:  Left hip pain EXAM: DG HIP (WITH OR WITHOUT PELVIS) 2-3V LEFT COMPARISON:  None Available. FINDINGS: There is no evidence of hip fracture or dislocation. There is no evidence of arthropathy or other focal bone abnormality. IMPRESSION: Negative. Electronically Signed   By: Jorje Guild M.D.   On: 08/13/2021 05:20    Procedures Procedures    Medications Ordered in ED Medications  ketorolac (TORADOL) injection 60 mg (has no administration in time range)  dexamethasone (DECADRON) injection 10 mg (has no administration in time range)    ED Course/ Medical Decision Making/ A&P                           Medical Decision Making Amount and/or Complexity of Data Reviewed Radiology: ordered.   Patient presents to the emergency department for evaluation of left hip and leg pain.  Patient thinks that she injured her leg as outlined above.  She had an injury with hyperflexing the hip during sex a week ago which healed and then she reinjured it again.  Patient with pain into the groin as well as the left thigh area.  Normal strength and sensation.  No  change in bowel or bladder function.  No midline tenderness in the lumbosacral region.  X-ray of hip is negative.  Symptoms likely secondary to soft tissue injury and nerve impingement secondary to inflammation.  Less likely felt to be  herniated disc.  As she has normal function, no red flags for cauda equina syndrome or signs of neurosurgical emergency, treat empirically with steroids, analgesia, rest and follow-up.        Final Clinical Impression(s) / ED Diagnoses Final diagnoses:  Hip strain, left, initial encounter  Sciatica of left side    Rx / DC Orders ED Discharge Orders          Ordered    oxyCODONE-acetaminophen (PERCOCET) 5-325 MG tablet  Every 4 hours PRN        08/13/21 0534    meloxicam (MOBIC) 15 MG tablet  Daily        08/13/21 0534              Orpah Greek, MD 08/13/21 [phone]

## 2021-08-13 NOTE — ED Notes (Signed)
Pt oob ambulating independently to hall bathroom using tripod cane -- toe touch weight bearing LLE observed

## 2021-08-13 NOTE — ED Notes (Signed)
ED Provider at bedside. 

## 2021-08-20 DIAGNOSIS — G4733 Obstructive sleep apnea (adult) (pediatric): Secondary | ICD-10-CM | POA: Diagnosis not present

## 2021-10-20 DIAGNOSIS — F411 Generalized anxiety disorder: Secondary | ICD-10-CM | POA: Diagnosis not present

## 2021-10-20 DIAGNOSIS — F1010 Alcohol abuse, uncomplicated: Secondary | ICD-10-CM | POA: Diagnosis not present

## 2021-10-27 DIAGNOSIS — F411 Generalized anxiety disorder: Secondary | ICD-10-CM | POA: Diagnosis not present

## 2021-10-27 DIAGNOSIS — F1010 Alcohol abuse, uncomplicated: Secondary | ICD-10-CM | POA: Diagnosis not present

## 2021-11-06 DIAGNOSIS — F411 Generalized anxiety disorder: Secondary | ICD-10-CM | POA: Diagnosis not present

## 2021-11-06 DIAGNOSIS — F1010 Alcohol abuse, uncomplicated: Secondary | ICD-10-CM | POA: Diagnosis not present

## 2021-11-11 DIAGNOSIS — F1010 Alcohol abuse, uncomplicated: Secondary | ICD-10-CM | POA: Diagnosis not present

## 2021-11-11 DIAGNOSIS — F411 Generalized anxiety disorder: Secondary | ICD-10-CM | POA: Diagnosis not present

## 2021-11-14 DIAGNOSIS — F411 Generalized anxiety disorder: Secondary | ICD-10-CM | POA: Diagnosis not present

## 2021-11-25 DIAGNOSIS — F1010 Alcohol abuse, uncomplicated: Secondary | ICD-10-CM | POA: Diagnosis not present

## 2021-11-25 DIAGNOSIS — F411 Generalized anxiety disorder: Secondary | ICD-10-CM | POA: Diagnosis not present

## 2021-12-02 DIAGNOSIS — F411 Generalized anxiety disorder: Secondary | ICD-10-CM | POA: Diagnosis not present

## 2021-12-02 DIAGNOSIS — F1010 Alcohol abuse, uncomplicated: Secondary | ICD-10-CM | POA: Diagnosis not present

## 2021-12-04 DIAGNOSIS — F411 Generalized anxiety disorder: Secondary | ICD-10-CM | POA: Diagnosis not present

## 2021-12-05 DIAGNOSIS — F4010 Social phobia, unspecified: Secondary | ICD-10-CM | POA: Diagnosis not present

## 2021-12-05 DIAGNOSIS — F411 Generalized anxiety disorder: Secondary | ICD-10-CM | POA: Diagnosis not present

## 2021-12-09 DIAGNOSIS — F1010 Alcohol abuse, uncomplicated: Secondary | ICD-10-CM | POA: Diagnosis not present

## 2021-12-09 DIAGNOSIS — F411 Generalized anxiety disorder: Secondary | ICD-10-CM | POA: Diagnosis not present

## 2021-12-11 DIAGNOSIS — F411 Generalized anxiety disorder: Secondary | ICD-10-CM | POA: Diagnosis not present

## 2021-12-16 DIAGNOSIS — F411 Generalized anxiety disorder: Secondary | ICD-10-CM | POA: Diagnosis not present

## 2021-12-16 DIAGNOSIS — F1010 Alcohol abuse, uncomplicated: Secondary | ICD-10-CM | POA: Diagnosis not present

## 2021-12-24 DIAGNOSIS — F1010 Alcohol abuse, uncomplicated: Secondary | ICD-10-CM | POA: Diagnosis not present

## 2021-12-24 DIAGNOSIS — F411 Generalized anxiety disorder: Secondary | ICD-10-CM | POA: Diagnosis not present

## 2021-12-25 ENCOUNTER — Telehealth: Payer: Self-pay

## 2021-12-25 DIAGNOSIS — F411 Generalized anxiety disorder: Secondary | ICD-10-CM | POA: Diagnosis not present

## 2021-12-25 NOTE — Telephone Encounter (Signed)
Pt called in stating that she has the flu and has been dealing with symptoms for a week now. Pt wanted to know if she could schedule an virtual visit with pcp or if pcp would send in some meds for her symptoms to her pharmacy please. Please advise.  Cb#: [phone]

## 2021-12-30 DIAGNOSIS — F1010 Alcohol abuse, uncomplicated: Secondary | ICD-10-CM | POA: Diagnosis not present

## 2021-12-30 DIAGNOSIS — F411 Generalized anxiety disorder: Secondary | ICD-10-CM | POA: Diagnosis not present

## 2022-01-06 DIAGNOSIS — F1010 Alcohol abuse, uncomplicated: Secondary | ICD-10-CM | POA: Diagnosis not present

## 2022-01-06 DIAGNOSIS — F411 Generalized anxiety disorder: Secondary | ICD-10-CM | POA: Diagnosis not present

## 2022-01-14 DIAGNOSIS — F1010 Alcohol abuse, uncomplicated: Secondary | ICD-10-CM | POA: Diagnosis not present

## 2022-01-14 DIAGNOSIS — F411 Generalized anxiety disorder: Secondary | ICD-10-CM | POA: Diagnosis not present

## 2022-01-15 DIAGNOSIS — F1010 Alcohol abuse, uncomplicated: Secondary | ICD-10-CM | POA: Diagnosis not present

## 2022-01-15 DIAGNOSIS — F411 Generalized anxiety disorder: Secondary | ICD-10-CM | POA: Diagnosis not present

## 2022-01-19 DIAGNOSIS — F1010 Alcohol abuse, uncomplicated: Secondary | ICD-10-CM | POA: Diagnosis not present

## 2022-01-19 DIAGNOSIS — F411 Generalized anxiety disorder: Secondary | ICD-10-CM | POA: Diagnosis not present

## 2022-01-23 DIAGNOSIS — J0110 Acute frontal sinusitis, unspecified: Secondary | ICD-10-CM | POA: Diagnosis not present

## 2022-01-23 DIAGNOSIS — R070 Pain in throat: Secondary | ICD-10-CM | POA: Diagnosis not present

## 2022-01-23 DIAGNOSIS — H9203 Otalgia, bilateral: Secondary | ICD-10-CM | POA: Diagnosis not present

## 2022-01-30 DIAGNOSIS — H6992 Unspecified Eustachian tube disorder, left ear: Secondary | ICD-10-CM | POA: Diagnosis not present

## 2022-01-30 DIAGNOSIS — J0110 Acute frontal sinusitis, unspecified: Secondary | ICD-10-CM | POA: Diagnosis not present

## 2022-02-02 IMAGING — CR DG CERVICAL SPINE COMPLETE 4+V
5 series · 5 of 5 positions shown · non-contrast
Comparison: None.

CLINICAL DATA: Neck and shoulder pain

EXAM:
CERVICAL SPINE - COMPLETE 4+ VIEW

[w c-spine lat]
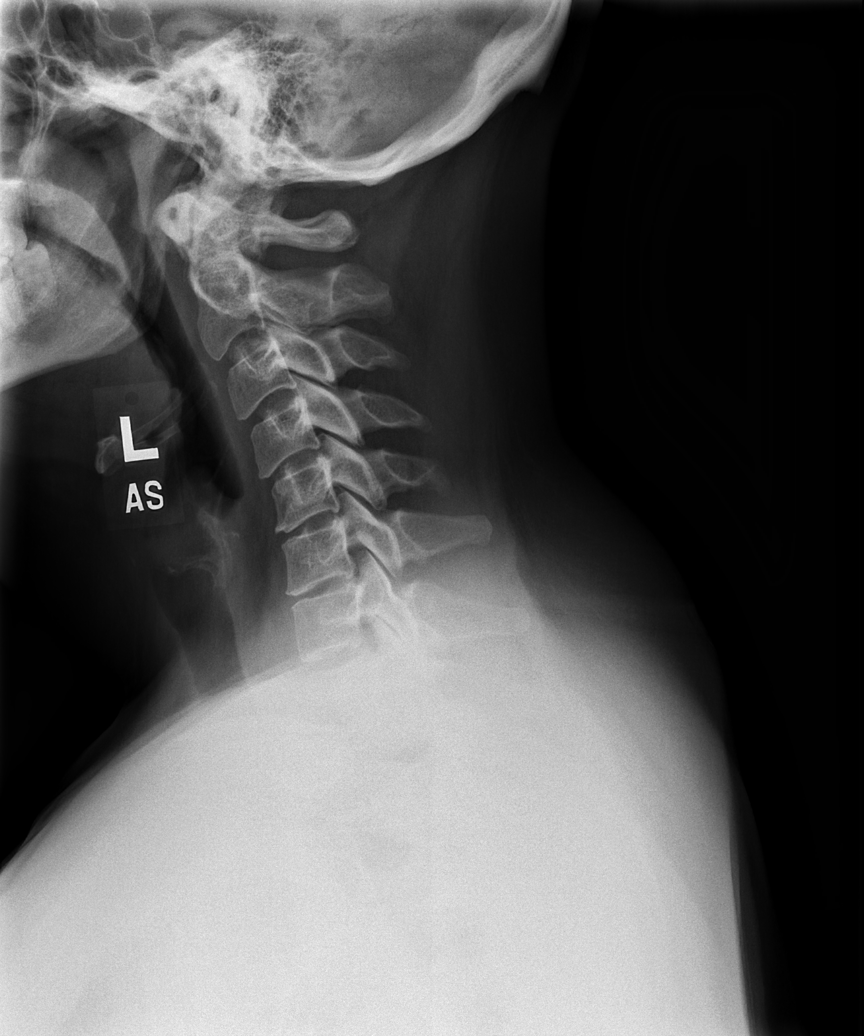

[w c-spine oblique (1 of 2)]
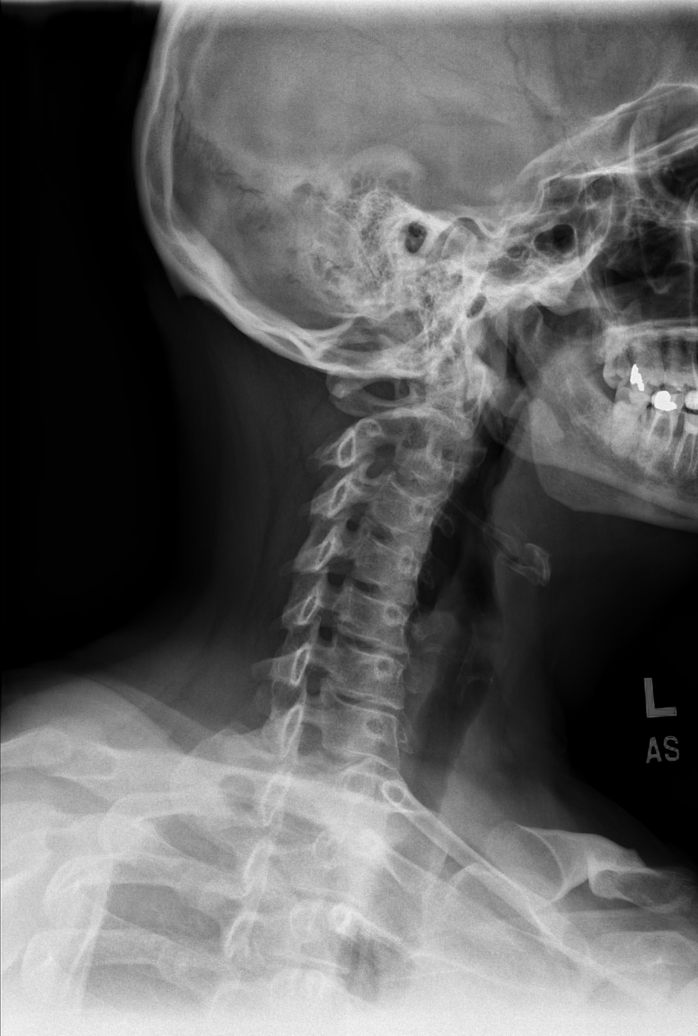

[w c-spine oblique (2 of 2)]
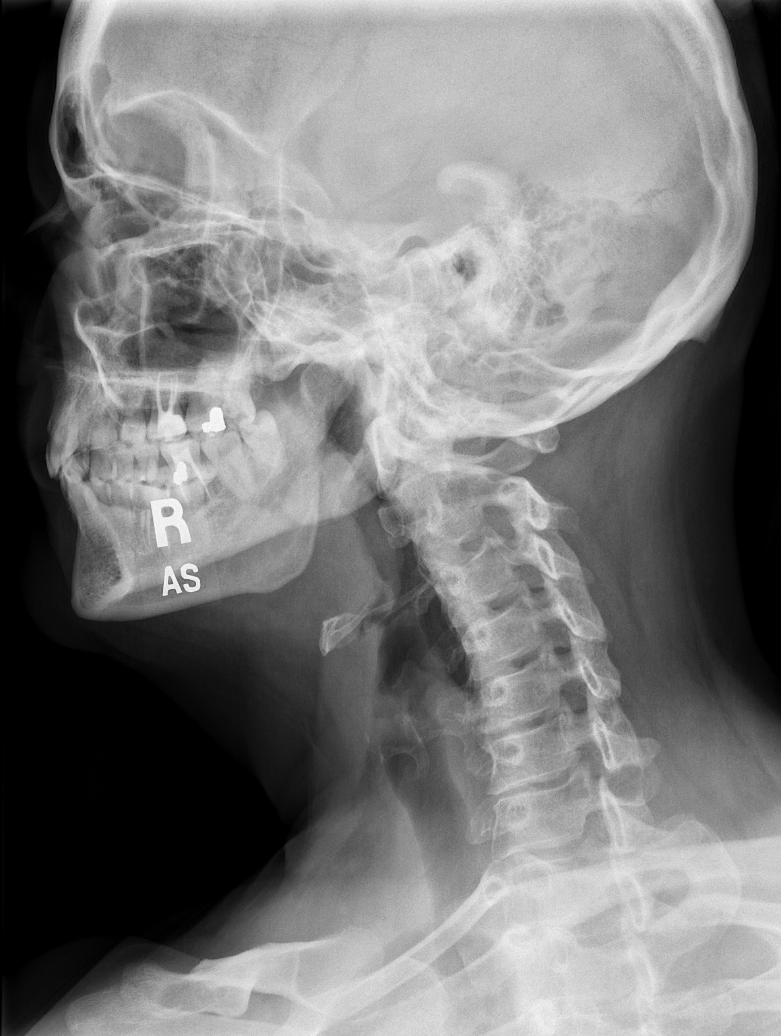

[w c-spine a.p. *]
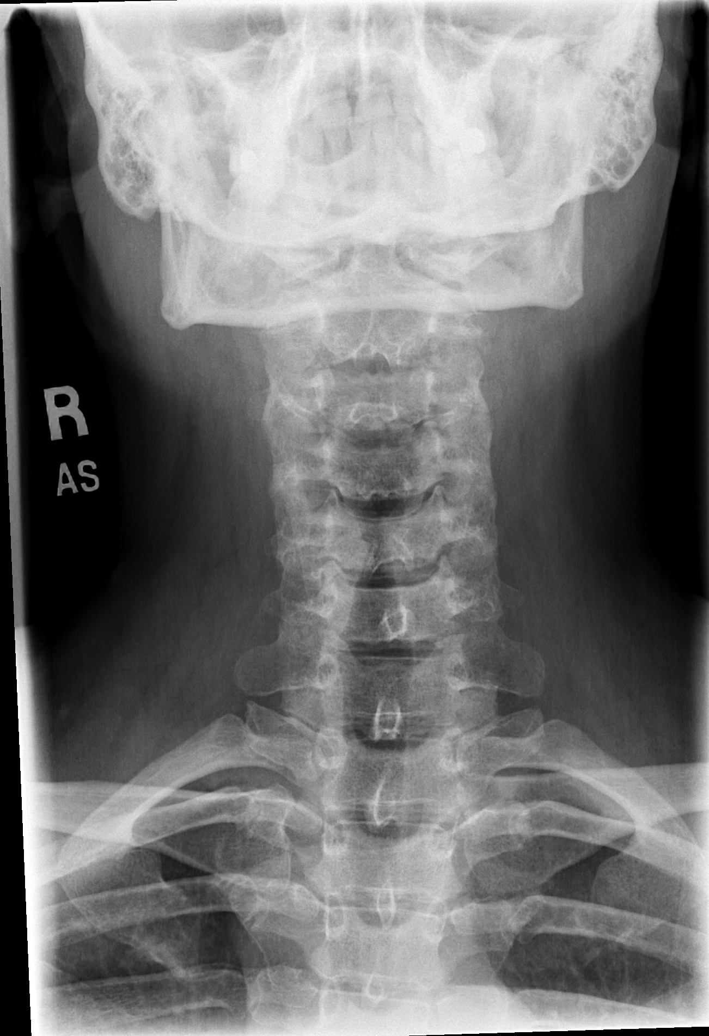

[w c-spine odontoid *]
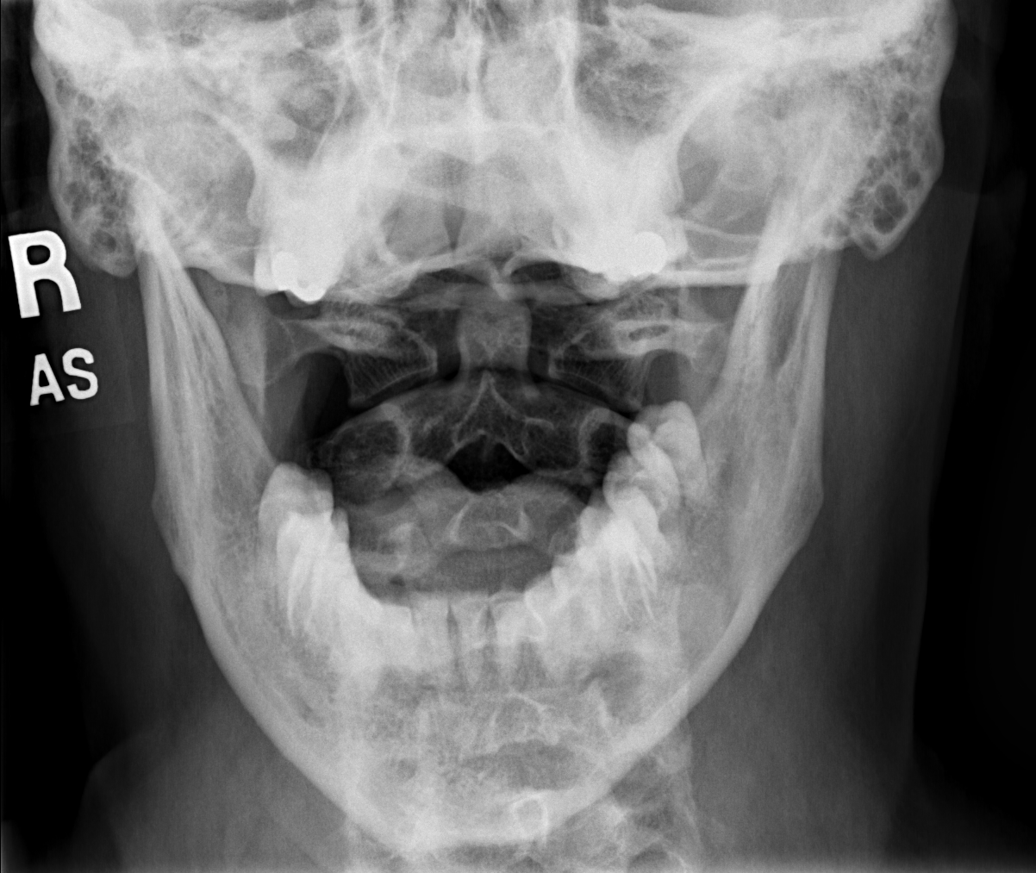

[5 of 5 positions shown; findings below may reference images not displayed]

FINDINGS: Reversal of cervical lordosis. Vertebral body heights are normal.
Mild disc space narrowing and osteophyte C5-C6 and C6-C7. Dens and
lateral masses are within normal limits. Possible foraminal
narrowing on the right at C3-C4, C5-C6 and C6-C7.
IMPRESSION: Reversal of cervical lordosis with mild degenerative changes at
C5-C6 and C6-C7.

## 2022-02-05 DIAGNOSIS — F411 Generalized anxiety disorder: Secondary | ICD-10-CM | POA: Diagnosis not present

## 2022-02-08 DIAGNOSIS — J029 Acute pharyngitis, unspecified: Secondary | ICD-10-CM | POA: Diagnosis not present

## 2022-02-08 DIAGNOSIS — Z862 Personal history of diseases of the blood and blood-forming organs and certain disorders involving the immune mechanism: Secondary | ICD-10-CM | POA: Diagnosis not present

## 2022-02-09 DIAGNOSIS — F1010 Alcohol abuse, uncomplicated: Secondary | ICD-10-CM | POA: Diagnosis not present

## 2022-02-09 DIAGNOSIS — F411 Generalized anxiety disorder: Secondary | ICD-10-CM | POA: Diagnosis not present

## 2022-02-26 DIAGNOSIS — F411 Generalized anxiety disorder: Secondary | ICD-10-CM | POA: Diagnosis not present

## 2022-02-27 DIAGNOSIS — J029 Acute pharyngitis, unspecified: Secondary | ICD-10-CM | POA: Diagnosis not present

## 2022-02-27 DIAGNOSIS — J329 Chronic sinusitis, unspecified: Secondary | ICD-10-CM | POA: Diagnosis not present

## 2022-02-27 DIAGNOSIS — H9203 Otalgia, bilateral: Secondary | ICD-10-CM | POA: Diagnosis not present

## 2022-07-22 ENCOUNTER — Encounter: Payer: Self-pay | Admitting: Cardiovascular Disease

## 2022-07-22 NOTE — Progress Notes (Signed)
Cardiology Office Note:    Date:  07/23/2022   ID:  Karina [name], DOB 1975-05-21, MRN [id number]  PCP:  Donita Brooks, MD   [facility]  Cardiologist:  Deegan Valentino  Advanced Practice Provider:  No care team member to display Electrophysiologist:  None   Referring MD: Donita Brooks, MD   Chief Complaint  Patient presents with   Hypertension        Palpitations     June 21, 2020   Karina [name] is a 47 y.o. female with a hx of HTN,  She has recently been having some palpitations and we were asked to see her for these palpitations by Dr. Osborn Coho   I saw her many years ago . She is here for elevated BP  Has recently improved her diet - lower sodium . Has gone through menopause 2 years ago  Lots of hormonal irregularieis Wt is 189 lbs.  Basically unchaged.  No regular exercise , walks on occasion Works on her mini-farm    Primary MD    July 02, 2020: Karina [name] is seen today for follow up of her HTN BP is much better .  Diet has improved some.  She is lost about a pound.  She is also exercising more.  We discussed her high triglyceride levels on her high  cholesterol levels.  Encouraged her to continue to work on improved diet exercise and weight loss. Wt is 188 lbs .   May, 15, 2024 Karina [name] is seen for follow up visit Wt is 196 lbs  Has not been taking her meds BP has been elevated Had a tooth pulled ( had cracked down to the gum )   She wants to take better care of himself now  Has been taking care of her father Arlan Organ)  Has been depressed about her marriage , was having some issues Those issues are much better  Lots of stress from her father  Has gained 40 lbs  Has OSA, sleeps with CPAP  Has cut back on her drinking ,  wants to stop smoking  Smokes 1 ppd     Past Medical History:  Diagnosis Date   Abnormal Pap smear    Anemia    Anxiety    BMI 25.0-25.9,adult    Ectopic pregnancy    GERD (gastroesophageal  reflux disease)    Headache(784.0)    Hypertension    Ovarian cyst    Palpitations    Sinus infection    Vitamin D deficiency     Past Surgical History:  Procedure Laterality Date   CERVICAL CONE BIOPSY     ECTOPIC PREGNANCY SURGERY     ruptured   TONSILLECTOMY     US ECHOCARDIOGRAPHY  08-30-2007   EF 60-65%    Current Medications: Current Meds  Medication Sig   carvedilol (COREG) 12.5 MG tablet Take 1 tablet (12.5 mg total) by mouth 2 (two) times daily.   ferrous sulfate 325 (65 FE) MG tablet TAKE 1 TABLET BY MOUTH EVERY DAY   fluticasone (FLONASE) 50 MCG/ACT nasal spray SPRAY 2 SPRAYS INTO EACH NOSTRIL EVERY DAY   hydrochlorothiazide (HYDRODIURIL) 25 MG tablet Take 1 tablet (25 mg total) by mouth daily.   potassium chloride (KLOR-CON) 10 MEQ tablet Take 1 tablet (10 mEq total) by mouth daily.   Probiotic Product (ALIGN) 4 MG CAPS 1 CAPSLE DAILY   valsartan (DIOVAN) 80 MG tablet Take 1 tablet (80 mg total) by mouth  daily.     Allergies:   Prednisone   Social History   Socioeconomic History   Marital status: Married    Spouse name: Not on file   Number of children: Not on file   Years of education: Not on file   Highest education level: Not on file  Occupational History   Not on file  Tobacco Use   Smoking status: Former   Smokeless tobacco: Never  Substance and Sexual Activity   Alcohol use: Yes    Comment: Daily   Drug use: Not on file   Sexual activity: Yes    Birth control/protection: None  Other Topics Concern   Not on file  Social History Narrative   Not on file   Social Determinants of Health   Financial Resource Strain: Not on file  Food Insecurity: Not on file  Transportation Needs: Not on file  Physical Activity: Not on file  Stress: Not on file  Social Connections: Not on file     Family History: The patient's family history includes Hypertension in her father.  ROS:   Please see the history of present illness.     All other systems  reviewed and are negative.  EKGs/Labs/Other Studies Reviewed:    The following studies were reviewed today:   EKG: Jul 23, 2022: Normal sinus rhythm at 99.  Otherwise normal EKG.   Recent Labs: No results found for requested labs within last 365 days.  Recent Lipid Panel    Component Value Date/Time   CHOL 218 (H) 06/21/2020 1538   TRIG 314 (H) 06/21/2020 1538   HDL 44 06/21/2020 1538   CHOLHDL 5.0 (H) 06/21/2020 1538   CHOLHDL 5.0 (H) 10/11/2019 1312   VLDL 36 (H) 08/28/2015 1105   LDLCALC 119 (H) 06/21/2020 1538   LDLCALC 137 (H) 10/11/2019 1312     Risk Assessment/Calculations:       Physical Exam:     Physical Exam: Blood pressure (!) 150/100, pulse 99, height 5\' 4"  (1.626 m), weight 196 lb 9.6 oz (89.2 kg), SpO2 96 %.  HYPERTENSION CONTROL Vitals:   07/23/22 1008 07/23/22 1030  BP: (!) 160/110 (!) 150/100    The patient's blood pressure is elevated above target today.  In order to address the patient's elevated BP: A new medication was prescribed today.       GEN:  Well nourished, well developed in no acute distress HEENT: Normal NECK: No JVD; No carotid bruits LYMPHATICS: No lymphadenopathy CARDIAC: RRR soft systolic murmur at LSB radiating to her L ax line RESPIRATORY:  Clear to auscultation without rales, wheezing or rhonchi  ABDOMEN: Soft, non-tender, non-distended MUSCULOSKELETAL:  No edema; No deformity  SKIN: Warm and dry NEUROLOGIC:  Alert and oriented x 3   ASSESSMENT:    1. Primary hypertension   2. Mixed hyperlipidemia   3. Medication management     PLAN:       1.  Hypertension:   Will restart her meds, coreg, HCTZ / Kdur, Valsartan  Carvedilol: 12.5 mg tablets.  You will start 1/2 tablet twice a day for 2 weeks.  At that point you will increase to 12.5 mg tablet twice a day  Hydrochlorothiazide 25 mg a day-start in 3 weeks Potassium chloride 10 mEq a day-will take a potassium tablet each time you take hydrochlorothiazide  and you will start in 3 weeks.  Valsartan 80 mg a day-start in 4 weeks.  Follow up in 5-6 weeks Will see her again in 5-6 weeks  for follow up      2.  Hyperlipidemia:  check liids today    Medication Adjustments/Labs and Tests Ordered: Current medicines are reviewed at length with the patient today.  Concerns regarding medicines are outlined above.  Orders Placed This Encounter  Procedures   Lipid panel   Basic metabolic panel   Basic metabolic panel   EKG 12-Lead   Meds ordered this encounter  Medications   carvedilol (COREG) 12.5 MG tablet    Sig: Take 1 tablet (12.5 mg total) by mouth 2 (two) times daily.    Dispense:  180 tablet    Refill:  3   valsartan (DIOVAN) 80 MG tablet    Sig: Take 1 tablet (80 mg total) by mouth daily.    Dispense:  90 tablet    Refill:  3    Dose DECREASE   hydrochlorothiazide (HYDRODIURIL) 25 MG tablet    Sig: Take 1 tablet (25 mg total) by mouth daily.    Dispense:  90 tablet    Refill:  3   potassium chloride (KLOR-CON) 10 MEQ tablet    Sig: Take 1 tablet (10 mEq total) by mouth daily.    Dispense:  90 tablet    Refill:  3    Patient Instructions  Medication Instructions:  Carvedilol: 12.5 mg tablets.  You will start 1/2 tablet twice a day for 2 weeks.  At that point you will increase to 12.5 mg tablet twice a day  Hydrochlorothiazide 25 mg a day-start in 3 weeks  Potassium chloride 10 mEq a day-take a potassium tablet each time you take hydrochlorothiazide and you will start in 3 weeks.  Valsartan 80 mg a day-start in 4 weeks. *If you need a refill on your cardiac medications before your next appointment, please call your pharmacy*  Lab Work: Lipids, BMET today Repeat BMET in 6 weeks at next visit If you have labs (blood work) drawn today and your tests are completely normal, you will receive your results only by: MyChart Message (if you have MyChart) OR A paper copy in the mail If you have any lab test that is abnormal or  we need to change your treatment, we will call you to review the results.  Testing/Procedures: NONE  Follow-Up: At [facility], you and your health needs are our priority.  As part of our continuing mission to provide you with exceptional heart care, we have created designated Provider Care Teams.  These Care Teams include your primary Cardiologist (physician) and Advanced Practice Providers (APPs -  Physician Assistants and Nurse Practitioners) who all work together to provide you with the care you need, when you need it.  Your next appointment:   6 week(s)  Provider:   Kristeen Miss, MD        Signed, Kristeen Miss, MD  07/23/2022 6:01 PM    [facility]

## 2022-07-23 ENCOUNTER — Ambulatory Visit: Payer: 59 | Attending: Cardiovascular Disease | Admitting: Cardiovascular Disease

## 2022-07-23 ENCOUNTER — Encounter: Payer: Self-pay | Admitting: Cardiovascular Disease

## 2022-07-23 VITALS — BP 150/100 | HR 99 | Ht 64.0 in | Wt 196.6 lb

## 2022-07-23 DIAGNOSIS — I10 Essential (primary) hypertension: Secondary | ICD-10-CM | POA: Diagnosis not present

## 2022-07-23 DIAGNOSIS — Z79899 Other long term (current) drug therapy: Secondary | ICD-10-CM | POA: Diagnosis not present

## 2022-07-23 DIAGNOSIS — E782 Mixed hyperlipidemia: Secondary | ICD-10-CM

## 2022-07-23 MED ORDER — HYDROCHLOROTHIAZIDE 25 MG PO TABS: 25.0000 mg | ORAL_TABLET | Freq: Every day | ORAL | 3 refills | Status: AC

## 2022-07-23 MED ORDER — POTASSIUM CHLORIDE ER 10 MEQ PO TBCR: 10.0000 meq | EXTENDED_RELEASE_TABLET | Freq: Every day | ORAL | 3 refills | Status: AC

## 2022-07-23 MED ORDER — CARVEDILOL 12.5 MG PO TABS: 12.5000 mg | ORAL_TABLET | Freq: Two times a day (BID) | ORAL | 3 refills | Status: AC

## 2022-07-23 MED ORDER — VALSARTAN 80 MG PO TABS: 80.0000 mg | ORAL_TABLET | Freq: Every day | ORAL | 3 refills | Status: AC

## 2022-07-23 NOTE — Patient Instructions (Signed)
Medication Instructions:  Carvedilol: 12.5 mg tablets.  You will start 1/2 tablet twice a day for 2 weeks.  At that point you will increase to 12.5 mg tablet twice a day  Hydrochlorothiazide 25 mg a day-start in 3 weeks  Potassium chloride 10 mEq a day-take a potassium tablet each time you take hydrochlorothiazide and you will start in 3 weeks.  Valsartan 80 mg a day-start in 4 weeks. *If you need a refill on your cardiac medications before your next appointment, please call your pharmacy*  Lab Work: Lipids, BMET today Repeat BMET in 6 weeks at next visit If you have labs (blood work) drawn today and your tests are completely normal, you will receive your results only by: MyChart Message (if you have MyChart) OR A paper copy in the mail If you have any lab test that is abnormal or we need to change your treatment, we will call you to review the results.  Testing/Procedures: NONE  Follow-Up: At [facility], you and your health needs are our priority.  As part of our continuing mission to provide you with exceptional heart care, we have created designated Provider Care Teams.  These Care Teams include your primary Cardiologist (physician) and Advanced Practice Providers (APPs -  Physician Assistants and Nurse Practitioners) who all work together to provide you with the care you need, when you need it.  Your next appointment:   6 week(s)  Provider:   Kristeen Miss, MD

## 2022-07-24 ENCOUNTER — Telehealth: Payer: Self-pay | Admitting: Cardiovascular Disease

## 2022-07-24 DIAGNOSIS — Z79899 Other long term (current) drug therapy: Secondary | ICD-10-CM

## 2022-07-24 DIAGNOSIS — E782 Mixed hyperlipidemia: Secondary | ICD-10-CM

## 2022-07-24 LAB — LIPID PANEL
Chol/HDL Ratio: 5 ratio — ABNORMAL HIGH (ref 0.0–4.4)
Cholesterol, Total: 237 mg/dL — ABNORMAL HIGH (ref 100–199)
HDL: 47 mg/dL (ref >39)
LDL Chol Calc (NIH): 160 mg/dL — ABNORMAL HIGH (ref 0–99)
Triglycerides: 167 mg/dL — ABNORMAL HIGH (ref 0–149)
VLDL Cholesterol Cal: 30 mg/dL (ref 5–40)

## 2022-07-24 LAB — BASIC METABOLIC PANEL
BUN/Creatinine Ratio: 25 — ABNORMAL HIGH (ref 9–23)
BUN: 18 mg/dL (ref 6–24)
CO2: 26 mmol/L (ref 20–29)
Calcium: 9.4 mg/dL (ref 8.7–10.2)
Chloride: 102 mmol/L (ref 96–106)
Creatinine, Ser: 0.72 mg/dL (ref 0.57–1.00)
Glucose: 103 mg/dL — ABNORMAL HIGH (ref 70–99)
Potassium: 4.6 mmol/L (ref 3.5–5.2)
Sodium: 140 mmol/L (ref 134–144)
eGFR: 103 mL/min/{1.73_m2} (ref >59)

## 2022-07-24 MED ORDER — ROSUVASTATIN CALCIUM 10 MG PO TABS: 10.0000 mg | ORAL_TABLET | Freq: Every day | ORAL | 3 refills | Status: AC

## 2022-07-24 NOTE — Telephone Encounter (Signed)
Called and left detailed message per DPR with MD's recommendations. Medication sent to pharmacy on file and labs scheduled for 10/26/22. Asked pt to call back if questions/concerns regarding plan.

## 2022-07-24 NOTE — Telephone Encounter (Signed)
-----   Message from Vesta Mixer, MD sent at 07/24/2022  8:03 AM EDT ----- Please start Rosuvastatin 10 mg  Lipids, ALT, BMP in 3 months

## 2022-08-25 ENCOUNTER — Ambulatory Visit: Payer: 59 | Admitting: Cardiovascular Disease

## 2022-08-25 ENCOUNTER — Ambulatory Visit: Payer: 59

## 2022-10-22 NOTE — Progress Notes (Signed)
Cardiology Office Note:    Date:  10/22/2022   ID:  DORAN DEC, DOB 01-09-1976, MRN [id number]  PCP:  Donita Brooks, MD    [facility]  Cardiologist:  Vihan Santagata  Advanced Practice Provider:  No care team member to display Electrophysiologist:  None   Referring MD: Donita Brooks, MD   Chief Complaint  Patient presents with   Hypertension        Shortness of Breath          June 21, 2020   Karina [name] is a 47 y.o. female with a hx of HTN,  She has recently been having some palpitations and we were asked to see her for these palpitations by Dr. Osborn Coho   I saw her many years ago . She is here for elevated BP  Has recently improved her diet - lower sodium . Has gone through menopause 2 years ago  Lots of hormonal irregularieis Wt is 189 lbs.  Basically unchaged.  No regular exercise , walks on occasion Works on her mini-farm    Primary MD    July 02, 2020: Karina [name] is seen today for follow up of her HTN BP is much better .  Diet has improved some.  She is lost about a pound.  She is also exercising more.  We discussed her high triglyceride levels on her high  cholesterol levels.  Encouraged her to continue to work on improved diet exercise and weight loss. Wt is 188 lbs .   May, 15, 2024 Karina [name] is seen for follow up visit Wt is 196 lbs  Has not been taking her meds BP has been elevated Had a tooth pulled ( had cracked down to the gum )   She wants to take better care of himself now  Has been taking care of her father Arlan Organ)  Has been depressed about her marriage , was having some issues Those issues are much better  Lots of stress from her father  Has gained 40 lbs  Has OSA, sleeps with CPAP  Has cut back on her drinking ,  wants to stop smoking  Smokes 1 ppd   Aug. 16, 2024 Karina [name] is seen for follow up.  When I last saw her in May, she was not taking care of her self - had stopped her meds, lots of  depression about her marriage, had gained 40 lbs,  We restarted her BP meds slowly over 4 weeks  She is here for evaluation     Past Medical History:  Diagnosis Date   Abnormal Pap smear    Anemia    Anxiety    BMI 25.0-25.9,adult    Ectopic pregnancy    GERD (gastroesophageal reflux disease)    Headache(784.0)    Hypertension    Ovarian cyst    Palpitations    Sinus infection    Vitamin D deficiency     Past Surgical History:  Procedure Laterality Date   CERVICAL CONE BIOPSY     ECTOPIC PREGNANCY SURGERY     ruptured   TONSILLECTOMY     US ECHOCARDIOGRAPHY  08-30-2007   EF 60-65%    Current Medications: No outpatient medications have been marked as taking for the 10/23/22 encounter (Office Visit) with Kabrea Seeney, Deloris Ping, MD.     Allergies:   Prednisone   Social History   Socioeconomic History   Marital status: Married    Spouse name: Not on file  Number of children: Not on file   Years of education: Not on file   Highest education level: Not on file  Occupational History   Not on file  Tobacco Use   Smoking status: Former   Smokeless tobacco: Never  Substance and Sexual Activity   Alcohol use: Yes    Comment: Daily   Drug use: Not on file   Sexual activity: Yes    Birth control/protection: None  Other Topics Concern   Not on file  Social History Narrative   Not on file   Social Determinants of Health   Financial Resource Strain: Not on file  Food Insecurity: Not on file  Transportation Needs: Not on file  Physical Activity: Not on file  Stress: Not on file  Social Connections: Not on file     Family History: The patient's family history includes Hypertension in her father.  ROS:   Please see the history of present illness.     All other systems reviewed and are negative.  EKGs/Labs/Other Studies Reviewed:    The following studies were reviewed today:   EKG:     Recent Labs: 07/23/2022: BUN 18; Creatinine, Ser 0.72; Potassium 4.6;  Sodium 140  Recent Lipid Panel    Component Value Date/Time   CHOL 237 (H) 07/23/2022 1115   TRIG 167 (H) 07/23/2022 1115   HDL 47 07/23/2022 1115   CHOLHDL 5.0 (H) 07/23/2022 1115   CHOLHDL 5.0 (H) 10/11/2019 1312   VLDL 36 (H) 08/28/2015 1105   LDLCALC 160 (H) 07/23/2022 1115   LDLCALC 137 (H) 10/11/2019 1312     Risk Assessment/Calculations:       Physical Exam:     Physical Exam: There were no vitals taken for this visit.  No BP recorded.  {Refresh Note OR Click here to enter BP  :1}***    GEN:  Well nourished, well developed in no acute distress HEENT: Normal NECK: No JVD; No carotid bruits LYMPHATICS: No lymphadenopathy CARDIAC: RRR ***, no murmurs, rubs, gallops RESPIRATORY:  Clear to auscultation without rales, wheezing or rhonchi  ABDOMEN: Soft, non-tender, non-distended MUSCULOSKELETAL:  No edema; No deformity  SKIN: Warm and dry NEUROLOGIC:  Alert and oriented x 3    ASSESSMENT:    No diagnosis found.   PLAN:       1.  Hypertension:   Will restart her meds, coreg, HCTZ / Kdur, Valsartan       2.  Hyperlipidemia:    Medication Adjustments/Labs and Tests Ordered: Current medicines are reviewed at length with the patient today.  Concerns regarding medicines are outlined above.  No orders of the defined types were placed in this encounter.  No orders of the defined types were placed in this encounter.   There are no Patient Instructions on file for this visit.   Signed, Kristeen Miss, MD  10/22/2022 7:07 PM    [facility]

## 2022-10-23 ENCOUNTER — Ambulatory Visit: Payer: No Typology Code available for payment source | Admitting: Cardiovascular Disease

## 2022-10-23 ENCOUNTER — Ambulatory Visit: Payer: No Typology Code available for payment source | Attending: Cardiovascular Disease

## 2022-10-26 ENCOUNTER — Ambulatory Visit: Payer: No Typology Code available for payment source | Attending: Family Medicine

## 2023-07-12 ENCOUNTER — Encounter: Payer: Self-pay | Admitting: Cardiovascular Disease

## 2024-04-06 ENCOUNTER — Other Ambulatory Visit: Payer: Self-pay | Admitting: Family Medicine

## 2024-04-06 DIAGNOSIS — Z1231 Encounter for screening mammogram for malignant neoplasm of breast: Secondary | ICD-10-CM

## 2024-04-11 ENCOUNTER — Ambulatory Visit: Admission: RE | Admit: 2024-04-11 | Discharge: 2024-04-11 | Disposition: A | Source: Ambulatory Visit

## 2024-04-11 DIAGNOSIS — Z1231 Encounter for screening mammogram for malignant neoplasm of breast: Secondary | ICD-10-CM
